# Patient Record
Sex: Female | Born: 1985 | Race: White | Hispanic: Yes | Marital: Married | Smoking: Never smoker
Health system: Southern US, Community
[De-identification: ages and names within clinical notes are randomized; demographics above are authoritative.]

## PROBLEM LIST (undated history)

## (undated) DIAGNOSIS — O24419 Gestational diabetes mellitus in pregnancy, unspecified control: Secondary | ICD-10-CM

## (undated) DIAGNOSIS — L0291 Cutaneous abscess, unspecified: Secondary | ICD-10-CM

## (undated) DIAGNOSIS — E119 Type 2 diabetes mellitus without complications: Secondary | ICD-10-CM

## (undated) HISTORY — PX: CHOLECYSTECTOMY: SHX55

## (undated) HISTORY — DX: Type 2 diabetes mellitus without complications: E11.9

---
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
## 2005-01-12 ENCOUNTER — Ambulatory Visit: Payer: Self-pay | Admitting: *Deleted

## 2005-01-12 ENCOUNTER — Inpatient Hospital Stay (HOSPITAL_COMMUNITY): Admission: AD | Admit: 2005-01-12 | Discharge: 2005-01-15 | Payer: Self-pay | Admitting: Family Medicine

## 2005-03-05 ENCOUNTER — Encounter (INDEPENDENT_AMBULATORY_CARE_PROVIDER_SITE_OTHER): Payer: Self-pay | Admitting: Specialist

## 2005-03-05 ENCOUNTER — Inpatient Hospital Stay (HOSPITAL_COMMUNITY): Admission: AD | Admit: 2005-03-05 | Discharge: 2005-03-06 | Payer: Self-pay | Admitting: General Surgery

## 2005-03-05 ENCOUNTER — Encounter: Payer: Self-pay | Admitting: *Deleted

## 2006-02-20 ENCOUNTER — Emergency Department (HOSPITAL_COMMUNITY): Admission: EM | Admit: 2006-02-20 | Discharge: 2006-02-20 | Payer: Self-pay | Admitting: Family Medicine

## 2007-02-14 ENCOUNTER — Ambulatory Visit (HOSPITAL_COMMUNITY): Admission: RE | Admit: 2007-02-14 | Discharge: 2007-02-14 | Payer: Self-pay | Admitting: Family Medicine

## 2007-05-20 ENCOUNTER — Inpatient Hospital Stay (HOSPITAL_COMMUNITY): Admission: AD | Admit: 2007-05-20 | Discharge: 2007-05-22 | Payer: Self-pay | Admitting: Obstetrics and Gynecology

## 2007-05-20 ENCOUNTER — Ambulatory Visit: Payer: Self-pay | Admitting: Obstetrics & Gynecology

## 2008-04-27 ENCOUNTER — Encounter (INDEPENDENT_AMBULATORY_CARE_PROVIDER_SITE_OTHER): Payer: Self-pay | Admitting: Family Medicine

## 2008-04-27 ENCOUNTER — Ambulatory Visit: Payer: Self-pay | Admitting: Internal Medicine

## 2008-04-27 LAB — CONVERTED CEMR LAB
Albumin: 4.9 g/dL (ref 3.5–5.2)
Alkaline Phosphatase: 105 units/L (ref 39–117)
BUN: 12 mg/dL (ref 6–23)
Creatinine, Ser: 0.53 mg/dL (ref 0.40–1.20)
Eosinophils Absolute: 1.6 10*3/uL — ABNORMAL HIGH (ref 0.0–0.7)
Eosinophils Relative: 18 % — ABNORMAL HIGH (ref 0–5)
Glucose, Bld: 105 mg/dL — ABNORMAL HIGH (ref 70–99)
HCT: 41 % (ref 36.0–46.0)
Hemoglobin: 14.2 g/dL (ref 12.0–15.0)
Lymphs Abs: 3.3 10*3/uL (ref 0.7–4.0)
MCV: 88.4 fL (ref 78.0–100.0)
Monocytes Absolute: 0.5 10*3/uL (ref 0.1–1.0)
Monocytes Relative: 6 % (ref 3–12)
Potassium: 4.1 meq/L (ref 3.5–5.3)
RBC: 4.64 M/uL (ref 3.87–5.11)
Total Bilirubin: 0.6 mg/dL (ref 0.3–1.2)
WBC: 8.9 10*3/uL (ref 4.0–10.5)

## 2008-04-30 ENCOUNTER — Ambulatory Visit: Payer: Self-pay | Admitting: *Deleted

## 2008-08-10 ENCOUNTER — Emergency Department (HOSPITAL_COMMUNITY): Admission: EM | Admit: 2008-08-10 | Discharge: 2008-08-10 | Payer: Self-pay | Admitting: Emergency Medicine

## 2011-03-31 NOTE — Discharge Summary (Signed)
NAME:  Angelica [name], Angelica [name]                   ACCOUNT NO.:  [id number]   MEDICAL RECORD NO.:  [id number]                   PATIENT TYPE:  INP   LOCATION:  9125                                 FACILITY:  [facility]   PHYSICIAN:  Mary Sella. Orlene Erm, M.D.                 DATE OF BIRTH:  February 09, 1986   DATE OF ADMISSION:  12/07/2002  DATE OF DISCHARGE:  12/11/2002                                 DISCHARGE SUMMARY   DISCHARGE DIAGNOSES:  1. Status post low transverse Cesarean section secondary to non-reassuring     fetal heart rate.  2. Endometritis.   DISCHARGE MEDICATIONS:  1. Percocet 5/325 mg one every four to six hours as needed pain.  2. Micronor each day beginning Sunday, February 2004.  3. Iron 325 mg each day.   FOLLOW UP:  Return to [facility] in six weeks for postpartum visit.   HISTORY OF PRESENT ILLNESS:  This 25 year old Gravida II, Para I, 0/0/1  presented at 39 weeks and two days with the onset of labor and the patient  was noted to have some variable repetitive D cells at that time.   LABORATORY DATA:  Blood type B, Rh positive, antibody screen negative,  hemoglobin 13.5. Rubella non-immune. Syphilis non-reactive. HIV negative.  Gonorrhea and Chlamydia negative. GBS negative.   HOSPITAL COURSE:  The patient was admitted and artificial rupture of  membranes and given an amnio infusion for repetitive variable D cells. An IU  PC was placed as well and the fluid appeared clear with no meconium. The  patient continued to have repetitive variable D cells despite position  changes and amnio infusion and Dr. Orlene Erm took the patient to the OR for a  low transverse Cesarean section. See operative report for further details.  The patient tolerated the procedure well and delivered a viable female infant  with Apgar's of 8 at one minute and 9 at five minutes with pH of 7.20. On  the evening after the procedure the patient began complaining of bilateral  shoulder pain with deep  inspiration and an ABG and chest x-ray were  obtained. The ABG showed a pH of 7.423, pCO2 of 32.1, pO2 of 99.4, bicarb  20.6 on room air. Chest x-ray showed no acute disease but probable small  amount of free air in the right upper quadrant most likely postoperative  from Cesarean section. The patient was saturating at 96% on 3 liters by  nasal cannula. CBC was drawn as well that showed a white count of 11.8 and  hemoglobin of 11.8. Platelets 308,000. Serial CBC's were obtained and on  postoperative day one, white count was 12.9 and hemoglobin was down to 9.1.  Platelets 259,000. Iron therapy was started at that time and the patient  developed a fever to 101.6. A urinalysis was obtained which was within  normal limits. A urine culture showed no growth. Ampicillin, Gentamycin, and  Clindamycin were started on postoperative day  one for probable endometritis  and the patient responded well and was afebrile for 48 hours prior to  discharge. On the day of discharge, she was afebrile. Vital signs were  stable. She was breast feeding without difficulty and hemoglobin was stable  at 9.1. She will follow-up at [facility] in six weeks for a postpartum  check.   LABORATORY DATA:  Additional lab data revealed PT of 13, INR 0.9, PTT 34,  fibrinogen 379.     Billey Gosling, M.D.                       Mary Sella. Orlene Erm, M.D.    AS/MEDQ  D:  01/20/2003  T:  01/20/2003  Job:  [id number]   cc:   Mary Sella. Orlene Erm, M.D.  [address]  [place]  [place] [address]  Fax: [phone]

## 2011-03-31 NOTE — Op Note (Signed)
NAME:  Angelica [name], Angelica [name]                   ACCOUNT NO.:  [id number]   MEDICAL RECORD NO.:  [id number]                   PATIENT TYPE:  INP   LOCATION:  9125                                 FACILITY:  [facility]   PHYSICIAN:  Mary Sella. Orlene Erm, M.D.                 DATE OF BIRTH:  June 25, 1986   DATE OF PROCEDURE:  12/07/2002  DATE OF DISCHARGE:                                 OPERATIVE REPORT   PREOPERATIVE DIAGNOSIS:  This 25 year old gravida 2, para 1, at term with  nonreassuring fetal heart rate tracing.   POSTOPERATIVE DIAGNOSIS:  This 25 year old gravida 2, para 1, at term with  nonreassuring fetal heart rate tracing.   PROCEDURE:  Primary low transverse cesarean section.   SURGEON:  Enid Cutter, M.D.   ASSISTANT:  Marlinda Mike, C.N.M.   ANESTHESIA:  General endotracheal.   COMPLICATIONS:  None.   ESTIMATED BLOOD LOSS:  800 cc.   FINDINGS:  A viable female infant delivered at 79 with Apgars of 8 and 9,  cord pH 7.20.  Normal uterus, tubes, and ovaries.  A hyperspiral cord at  delivery.   DISPOSITION:  Recovery room, stable.   INDICATIONS FOR PROCEDURE:  The patient is a 25 year old gravida 2, para 1,  who presents with spontaneous onset of labor at term.  The patient was  admitted and soon after admission was noted to have repetitive severe  variable decelerations.  There was good variability in between.  I was  contacted to see the patient and the patient was 5 cm dilated.  The patient  continued to labor.  However, the severe variables became more intense with  decreasing variability and prolonged recovery to baseline.  Reexamination of  the cervix revealed no cervical change and the patient was remote from  delivery at 5 cm dilatation.  The decision was made then to proceed with  primary low transverse C-section secondary to nonreassuring fetal heart  tracing.  The patient was counseled on the risk of cesarean section via the  telephone translator.  The patient was  counseled on the risk of bleeding,  infection, injury to internal organs, risk of transfusion or emergency  hysterectomy.  The patient understands these risks and wishes to proceed.   PROCEDURE:  The patient was taken to the operating room where she was  prepped and draped in sterile fashion.  She placed under general  endotracheal anesthesia and a Pfannenstiel incision was made with a scalpel.  This was carried down to the underlying fascia which was entered sharply.  The fascia was dissected with the surgeon's fingers and the rectus muscle  bellies were separated.  The peritoneum was entered bluntly with surgeon's  fingers and the bladder blade was placed.  The bladder flap was created with  sharp and blunt dissection.  The uterus was scored with a scalpel and  incision carried down to the midline.  Once the uterine cavity was entered,  the surgeon's fingers were used to extend the incision laterally.  The  infant's head was grasped and the infant was delivered through the uterine  incision.  The cord was clamped and cut and the infant was handed off to the  waiting neonatal resuscitation team.  The uterus was then massaged and the  placenta was extracted.  The uterus exteriorized and curetted with a dry lap  sponge.  The uterine incision was repaired with running, locking suture of 0  chromic.  A second imbricating suture of 0 chromic was used.  The incision  was hemostatic and the uterus was returned to the abdominal cavity.  The  paracolonic gutters were emptied of clot and debris.  The incision was again  inspected and noted to be hemostatic.  The fascia was then closed with a  running suture of 0 Vicryl.  The subcutaneous tissues were irrigated and the  skin edges were reapproximated with staples.  The patient tolerated the  procedure well and was awakened and taken to the recovery room in stable  condition.  Sponge, instrument, and needle counts were correct at the end of  the  procedure.                                               Mary Sella. Orlene Erm, M.D.    EMH/MEDQ  D:  12/07/2002  T:  12/07/2002  Job:  [id number]

## 2011-03-31 NOTE — H&P (Signed)
Angelica [name], Angelica [name]         ACCOUNT NO.:  [id number]   MEDICAL RECORD NO.:  [id number]          PATIENT TYPE:  INP   LOCATION:  0456                         FACILITY:  [facility]   PHYSICIAN:  Angelia Mould. Derrell Lolling, M.D.DATE OF BIRTH:  1986-07-27   DATE OF ADMISSION:  03/05/2005  DATE OF DISCHARGE:                                HISTORY & PHYSICAL   CHIEF COMPLAINT:  Epigastric pain and vomiting.   HISTORY OF PRESENT ILLNESS:  This is a 25 year old Hispanic female,  originally from [place].  She reports epigastric pain radiating to her  back for the past 4 days.  She has been vomiting every day.  She has felt  feverish but has not taken her temperature.  She has not had any prior  similar episodes.  She is 1 month postpartum.  She went to the maternity  admissions unit at [facility] where she was evaluated by a nurse  practitioner.  An ultrasound was obtained which showed gallstones but was  otherwise normal including a normal looking pancreas and a normal looking  common bile duct.  Lab work was obtained which showed a hemoglobin of 10.9,  white count of 11,100, a reportedly normal pregnancy test, AST of 44, ALT of  124, total bilirubin 0.4, amylase 167, lipase of 45.  I was called.  Because  the patient was still having pain, she was transferred to [facility] for  decision making regarding management of her biliary tract disease.   PAST HISTORY:  She has had three pregnancies and three deliveries.  The  second pregnancy required a cesarean section but her most recent one did  not.  All three children are healthy.  She denies having any prior medical  or surgical problems other than the cesarean section.  She denies a history  of malaria or diabetes or hypertension.   CURRENT MEDICATIONS:  None.   DRUG ALLERGIES:  None known.   SOCIAL HISTORY:  The patient states that she is married.  She states that  the man in the room with [place] is her husband.  She states that she  immigrated  from [place] to the [place] 3 years ago.  She is unemployed.  She  denies the use of alcohol or tobacco.   FAMILY HISTORY:  Mother and father living and well.  She thinks her mother  has had a kidney problem recently.   REVIEW OF SYSTEMS:  All systems were reviewed.  They are noncontributory  except as described above.   PHYSICAL EXAMINATION:  GENERAL:  Healthy-appearing young Hispanic female in  mild to moderate distress.  She is cooperative.  The entire history and  physical exam encounter was chaperoned by Elease Hashimoto, the nurse on the floor  and interpreter Candra interpreted all of the interaction.  VITAL SIGNS:  Temperature 98.6, pulse 73, respirations 20, blood pressure  145/92.  HEENT:  Eyes:  Sclera clear.  Extraocular movements intact.  Ears, mouth and  throat, nose, lips, tongue and oropharynx is without gross lesions.  NECK:  Neck supple, nontender.  No adenopathy, no mass, no jugular venous  distention.  CHEST:  Lungs clear  to auscultation.  No real chest wall tenderness.  Breasts not examined.  Heart regular rate and rhythm.  No murmurs noted.  Radial, femoral, and posterior tibial pulses are palpable.  No peripheral  edema.  ABDOMEN:  She is tender in the epigastrium and the right upper quadrant with  minimal guarding.  No rebound, no mass, no distention.  Bowel sounds are  hypoactive.  No hernias noted.  EXTREMITIES:  She moves all four extremities well without pain or deformity.  NEUROLOGIC:  No gross motor or sensory deficits.   ADMISSION DATA:  Ultrasound and lab work as described above.   ASSESSMENT:  1.  Acute and chronic cholecystitis with cholelithiasis.  Due to persistent      pain for 4 days, she will need to be admitted to the hospital for      cholecystectomy.  2.  One month postpartum without apparent gynecologic or obstetric      complications.   PLAN:  1.  The patient will be admitted to the hospital.  2.  She will be started on  intravenous Unasyn.  3.  She will be taken to the operating room later today for laparoscopic      cholecystectomy with cholangiogram.   I have discussed the indication and details of surgery with her.  Risks and  complications have been outlined, including but not limited to bleeding,  infection, conversion to open laparotomy, injury to adjacent organ such as  the main bile duct or intestine with major reconstructive surgery, wound  problem such as infection or hernia, cardiac, pulmonary and thromboembolic  problems.  She seems to understand these issues well.  At this time all of  her questions were answered.  She is in full agreement with this plan as is  her husband.   As mentioned above the entire encounter was interpreted with our interpreter  Candra.      HMI/MEDQ  D:  03/05/2005  T:  03/05/2005  Job:  [id number]

## 2011-03-31 NOTE — Op Note (Signed)
NAMESIMARA, [name]         ACCOUNT NO.:  [id number]   MEDICAL RECORD NO.:  [id number]          PATIENT TYPE:  INP   LOCATION:  0456                         FACILITY:  [facility]   PHYSICIAN:  Angelia Mould. Derrell Lolling, M.D.DATE OF BIRTH:  04/13/1986   DATE OF PROCEDURE:  03/05/2005  DATE OF DISCHARGE:                                 OPERATIVE REPORT   PREOPERATIVE DIAGNOSIS:  Acute cholecystitis with cholelithiasis.   POSTOPERATIVE DIAGNOSIS:  Acute cholecystitis with cholelithiasis.   OPERATION PERFORMED:  Laparoscopic cholecystectomy with intraoperative  cholangiogram.   SURGEON:  Angelia Mould. Derrell Lolling, M.D.   FIRST ASSISTANT:  Gita Kudo, M.D.   OPERATIVE INDICATIONS:  This is a 25 year old Hispanic female who presents  with a four-day history of epigastric pain, nausea, vomiting, and back pain.  She has had three pregnancies and three deliveries.  She is one month post  partum.  She was evaluated and worked up.  Ultrasound reveals gallstones but  is otherwise normal.  White count 11,100.  Pregnancy test negative.  AST and  ALT are slightly elevated with a total bilirubin normal.  Amylase is  slightly elevated to 167.  Lipase normal at 45.  She is admitted to the  hospital today and brought to the operating room urgently for  cholecystectomy.   OPERATIVE FINDINGS:  The gallbladder was acutely inflamed.  It was quite  thick-walled and edematous.  The cholangiogram was normal, showing normal  intrahepatic and extrahepatic biliary ducts.  There was no filling defect  and no obstruction with good flow of contrast into the duodenum.  The liver,  stomach, duodenum, small intestines, large intestines, and appendix looked  normal.  The bladder was distended.  There was a little bit of blood in the  pelvis.  The uterus did not appear enlarged.   OPERATIVE TECHNIQUE:  Following the induction of general endotracheal  anesthesia, the patient's abdomen was prepped and draped in a  sterile  fashion.  Marcaine 0.5% with epinephrine was used as a local infiltration  anesthetic.  A vertically oriented incision was made at the lower end of the  umbilicus.  The fascia was incised in the midline, and the abdominal cavity  was entered under direct vision.  A 10 mm Hasson trocar was inserted and  secured with a purse-string suture of 0 Vicryl.  A pneumoperitoneum was  created.  The video camera was inserted with visualization and findings as  described above.  A 10 mm trocar was placed in the subxiphoid region, and  two 5 mm trocars were placed in the right mid abdomen.  The gallbladder was  very tense and thick-walled and had to be decompressed with the suction  trocar.  We then elevated the gallbladder and retracted the infundibulum  laterally.  We dissected out the cystic duct and the cystic artery.  The  cystic artery was isolated as it went onto the wall of the gallbladder,  secured with multiple metal clips and divided.  We then created a large  window behind the cystic duct.  We dissected out a very long segment of  cystic duct.  The cholangiogram catheter was  inserted into the cystic duct.  A cholangiogram was obtained using the C-arm.  This had normal findings, as  described above.  We had normal intrahepatic and extrahepatic biliary  anatomy.  We did not see any any filling defects, and there was good flow of  contrast into the duodenum.  The cholangiogram catheter was removed.  The  cystic duct was secured with multiple metal clips and divided.  The  gallbladder was dissected from its bed with electrocautery, placed in a  specimen bag, and removed.  We had a little bit of bleeding from the mid  portion of the bed of the gallbladder, which we cauterized.  We had to do  this several times, so we chose to put a piece of Surgicel in place, and  after five minutes, it was completely dry.  After removal of the  gallbladder, we irrigated the abdomen and subphrenic space  and pelvis quite  thoroughly, until all of the irrigation fluid was completely clear.  There  was no sign of any bleeding and no sign of any bile leak whatsoever.  The  trocars were removed under direct vision.  There was no bleeding from the  trocar sites.  The pneumoperitoneum was released.  The fascia at the  umbilicus was closed with 0 Vicryl sutures.  The skin incisions were closed  with subcuticular sutures of 4-0 Vicryl and Steri-Strips.  Clean bandages  were placed.  The patient was taken to the recovery room in stable  condition.  Estimated blood loss was about 15-20 cc.  Complications were  none.  Sponge, needle, and instrument counts were correct.      HMI/MEDQ  D:  03/05/2005  T:  03/05/2005  Job:  [id number]

## 2011-08-14 LAB — POCT PREGNANCY, URINE: Preg Test, Ur: NEGATIVE

## 2011-08-29 LAB — CBC
HCT: 37.5
Hemoglobin: 10.6 — ABNORMAL LOW
MCHC: 33.3
Platelets: 291
RBC: 3.73 — ABNORMAL LOW
RBC: 4.48
WBC: 9.5

## 2018-07-08 ENCOUNTER — Ambulatory Visit (HOSPITAL_COMMUNITY)
Admission: EM | Admit: 2018-07-08 | Discharge: 2018-07-08 | Disposition: A | Discharge status: Home / Self Care | Payer: Self-pay | Attending: Family Medicine | Admitting: Family Medicine

## 2018-07-08 ENCOUNTER — Encounter (HOSPITAL_COMMUNITY): Payer: Self-pay

## 2018-07-08 DIAGNOSIS — R739 Hyperglycemia, unspecified: Secondary | ICD-10-CM

## 2018-07-08 DIAGNOSIS — R1032 Left lower quadrant pain: Secondary | ICD-10-CM

## 2018-07-08 DIAGNOSIS — R1031 Right lower quadrant pain: Secondary | ICD-10-CM

## 2018-07-08 DIAGNOSIS — R112 Nausea with vomiting, unspecified: Secondary | ICD-10-CM

## 2018-07-08 LAB — POCT I-STAT, CHEM 8
BUN: 11 mg/dL (ref 6–20)
CALCIUM ION: 1.21 mmol/L (ref 1.15–1.40)
CREATININE: 0.5 mg/dL (ref 0.44–1.00)
Chloride: 94 mmol/L — ABNORMAL LOW (ref 98–111)
HCT: 43 % (ref 36.0–46.0)
HEMOGLOBIN: 14.6 g/dL (ref 12.0–15.0)
POTASSIUM: 4.1 mmol/L (ref 3.5–5.1)
SODIUM: 131 mmol/L — AB (ref 135–145)
TCO2: 24 mmol/L (ref 22–32)

## 2018-07-08 LAB — POCT URINALYSIS DIP (DEVICE)
Bilirubin Urine: NEGATIVE
Glucose, UA: >=1000 mg/dL — AB
Ketones, ur: NEGATIVE mg/dL
LEUKOCYTES UA: NEGATIVE
Nitrite: NEGATIVE
Protein, ur: NEGATIVE mg/dL
UROBILINOGEN UA: 0.2 mg/dL (ref 0.0–1.0)
pH: 5.5 (ref 5.0–8.0)

## 2018-07-08 LAB — POCT PREGNANCY, URINE: PREG TEST UR: NEGATIVE

## 2018-07-08 NOTE — Discharge Instructions (Signed)
Your glucose was greater than 700, greater than 1000 glucose in urine, with abdominal pain, nausea, vomiting, please go to the emergency department for further evaluation for new onset diabetes with hyperglycemia.

## 2018-07-08 NOTE — ED Triage Notes (Signed)
Pt presents with frequent urination, thirstiness, feeling lethargic, rash and itchiness all over body and frequent headaches for the past month.

## 2018-07-08 NOTE — ED Provider Notes (Signed)
[facility]    CSN: [id number] Arrival date & time: 07/08/18  1347     History   Chief Complaint Chief Complaint  Patient presents with  . Fatigue  . Polydipsia  . Rash    HPI Nilda Jamelle Rushing is a 32 y.o. female.   32 year old female comes in for 1 month history of polyuria, polydipsia, abdominal pain, nausea, vomiting, lethargy.  HPI obtained by patient through friend translating, declined Radiation protection practitioner.  Patient has had intermittent headache as well that is frequent, right-sided, but denies current headache.  She has had low abdominal tenderness with nausea and vomiting.  Denies URI symptoms such as cough, congestion, rhinorrhea.  Has had subjective fever at night with chills.  Denies vaginal discharge, itching, pain.  She denies dysuria, hematuria.  States has had itching to the arms with hives.  No new contacts/exposures.  Denies tick bites.  She gets Depo-Medrol injections, last one last month.  Not currently sexually active.  Never smoker.  Denies alcohol use, illicit drug use.     History reviewed. No pertinent past medical history.  There are no active problems to display for this patient.   History reviewed. No pertinent surgical history.  OB History   None      Home Medications    Prior to Admission medications   Not on File    Family History History reviewed. No pertinent family history.  Social History Social History   Tobacco Use  . Smoking status: Not on file  Substance Use Topics  . Alcohol use: Not on file  . Drug use: Not on file     Allergies   Patient has no known allergies.   Review of Systems Review of Systems  Reason unable to perform ROS: See HPI as above.     Physical Exam Triage Vital Signs ED Triage Vitals [07/08/18 1413]  Enc Vitals Group     BP 131/75     Pulse Rate 95     Resp 20     Temp 97.9 F (36.6 C)     Temp Source Temporal     SpO2 100 %     Weight      Height      Head  Circumference      Peak Flow      Pain Score      Pain Loc      Pain Edu?      Excl. in GC?    No data found.  Updated Vital Signs BP 131/75 (BP Location: Right Arm)   Pulse 95   Temp 97.9 F (36.6 C) (Temporal)   Resp 20   LMP  (LMP Unknown)   SpO2 100%   Physical Exam  Constitutional: She is oriented to person, place, and time. She appears well-developed and well-nourished. No distress.  HENT:  Head: Normocephalic and atraumatic.  Eyes: Pupils are equal, round, and reactive to light. Conjunctivae are normal.  Cardiovascular: Normal rate, regular rhythm and normal heart sounds. Exam reveals no gallop and no friction rub.  No murmur heard. Pulmonary/Chest: Effort normal and breath sounds normal. She has no wheezes. She has no rales.  Abdominal: Soft. Bowel sounds are normal. She exhibits no mass. There is no rigidity, no rebound, no guarding and no CVA tenderness.  Patient expresses tenderness to palpation of bilateral low abdomen without changes in expression, rebound, guarding.  Neurological: She is alert and oriented to person, place, and time.  Skin: Skin is warm and  dry.  Bilateral upper extremity maculopapular rash.  Scratch marks seen.  No erythema, warmth.  No tenderness to palpation.   Psychiatric: She has a normal mood and affect. Her behavior is normal. Judgment normal.    UC Treatments / Results  Labs (all labs ordered are listed, but only abnormal results are displayed) Labs Reviewed  POCT URINALYSIS DIP (DEVICE) - Abnormal; Notable for the following components:      Result Value   Glucose, UA >=1000 (*)    Hgb urine dipstick MODERATE (*)    All other components within normal limits  POCT I-STAT, CHEM 8 - Abnormal; Notable for the following components:   Sodium 131 (*)    Chloride 94 (*)    Glucose, Bld >700 (*)    All other components within normal limits  POCT PREGNANCY, URINE    EKG None  Radiology No results found.  Procedures Procedures  (including critical care time)  Medications Ordered in UC Medications - No data to display  Initial Impression / Assessment and Plan / UC Course  I have reviewed the triage vital signs and the nursing notes.  Pertinent labs & imaging results that were available during my care of the patient were reviewed by me and considered in my medical decision making (see chart for details).    32 year old female presents with 1 month history of polydipsia, polyuria, abdominal pain, nausea/vomiting, lethargy.  She has also had intermittent headache, and rash with itching.  Denies history of diabetes.  She has mild tenderness to palpation of bilateral lower abdomen, though without guarding/rebound/changes in expression.  I-STAT showed glucose of greater than 700.  Given new onset diabetes with hyperglycemia, patient with abdominal pain, nausea, vomiting, will discharge in stable condition to the emergency department for further evaluation and management needed.  Patient expresses understanding and agrees to plan.  Final Clinical Impressions(s) / UC Diagnoses   Final diagnoses:  Hyperglycemia  Bilateral lower abdominal pain  Intractable vomiting with nausea, unspecified vomiting type    ED Prescriptions    None        Belinda FisherYu, Nicolena Schurman V, PA-C 07/08/18 1503

## 2018-12-24 ENCOUNTER — Emergency Department (HOSPITAL_COMMUNITY): Payer: Self-pay

## 2018-12-24 ENCOUNTER — Emergency Department (HOSPITAL_COMMUNITY)
Admission: EM | Admit: 2018-12-24 | Discharge: 2018-12-24 | Disposition: A | Discharge status: Home / Self Care | Payer: Self-pay | Attending: Emergency Medicine | Admitting: Emergency Medicine

## 2018-12-24 ENCOUNTER — Encounter (HOSPITAL_COMMUNITY): Payer: Self-pay

## 2018-12-24 ENCOUNTER — Other Ambulatory Visit: Payer: Self-pay

## 2018-12-24 DIAGNOSIS — Y990 Civilian activity done for income or pay: Secondary | ICD-10-CM | POA: Insufficient documentation

## 2018-12-24 DIAGNOSIS — S0003XA Contusion of scalp, initial encounter: Secondary | ICD-10-CM | POA: Insufficient documentation

## 2018-12-24 DIAGNOSIS — Y9389 Activity, other specified: Secondary | ICD-10-CM | POA: Insufficient documentation

## 2018-12-24 DIAGNOSIS — W208XXA Other cause of strike by thrown, projected or falling object, initial encounter: Secondary | ICD-10-CM | POA: Insufficient documentation

## 2018-12-24 DIAGNOSIS — S0990XA Unspecified injury of head, initial encounter: Secondary | ICD-10-CM

## 2018-12-24 DIAGNOSIS — Y9289 Other specified places as the place of occurrence of the external cause: Secondary | ICD-10-CM | POA: Insufficient documentation

## 2018-12-24 NOTE — Discharge Instructions (Signed)
Return for any problem.  Follow-up with your regular care provider as instructed. °

## 2018-12-24 NOTE — ED Triage Notes (Signed)
Spanish interpreter used for triage. 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 Pt was hit on the left side of her head, reports blurred vision, dizziness. Pt a.o, ambulatory in triage.

## 2018-12-24 NOTE — ED Provider Notes (Signed)
[facility]   CSN: [id number] Arrival date & time: 12/24/18  1357     History   Chief Complaint Chief Complaint  Patient presents with  . Head Injury    HPI Angelica [name] is a 33 y.o. female.  33 year old female with prior medical history as detailed below presents for evaluation of close head injury.  Patient reports that she was at work.  A piece of metal fell and struck her on the left scalp.  She did not pass out.  She denies other injury.  She denies bleeding or break in the skin.  The history is provided by the patient and medical records. A language interpreter was used.  Head Injury  Location:  L parietal Mechanism of injury: direct blow   Pain details:    Quality:  Aching   Severity:  Mild   Timing:  Constant Chronicity:  New Relieved by:  Nothing Worsened by:  Nothing Ineffective treatments:  None tried   History reviewed. No pertinent past medical history.  There are no active problems to display for this patient.   History reviewed. No pertinent surgical history.   OB History   No obstetric history on file.      Home Medications    Prior to Admission medications   Not on File    Family History No family history on file.  Social History Social History   Tobacco Use  . Smoking status: Not on file  Substance Use Topics  . Alcohol use: Not on file  . Drug use: Not on file     Allergies   Patient has no known allergies.   Review of Systems Review of Systems  All other systems reviewed and are negative.    Physical Exam Updated Vital Signs BP 125/90   Pulse 82   Temp 98.8 F (37.1 C) (Oral)   Resp 16   SpO2 97%   Physical Exam Vitals signs and nursing note reviewed.  Constitutional:      General: She is not in acute distress.    Appearance: She is well-developed.  HENT:     Head: Normocephalic.     Comments: Mild contusion of the left scalp.  No bruising or  laceration noted. Eyes:     Conjunctiva/sclera: Conjunctivae normal.     Pupils: Pupils are equal, round, and reactive to light.  Neck:     Musculoskeletal: Normal range of motion and neck supple.     Comments: No cervical midline tenderness Cardiovascular:     Rate and Rhythm: Normal rate and regular rhythm.     Heart sounds: Normal heart sounds.  Pulmonary:     Effort: Pulmonary effort is normal. No respiratory distress.     Breath sounds: Normal breath sounds.  Abdominal:     General: There is no distension.     Palpations: Abdomen is soft.     Tenderness: There is no abdominal tenderness.  Musculoskeletal: Normal range of motion.        General: No deformity.  Skin:    General: Skin is warm and dry.  Neurological:     General: No focal deficit present.     Mental Status: She is alert and oriented to person, place, and time. Mental status is at baseline.     Cranial Nerves: No cranial nerve deficit.     Sensory: No sensory deficit.     Motor: No weakness.     Coordination: Coordination normal.  Comments: GCS 15      ED Treatments / Results  Labs (all labs ordered are listed, but only abnormal results are displayed) Labs Reviewed - No data to display  EKG None  Radiology Ct Head Wo Contrast  Result Date: 12/24/2018 CLINICAL DATA:  Piece of metal fell on head. EXAM: CT HEAD WITHOUT CONTRAST TECHNIQUE: Contiguous axial images were obtained from the base of the skull through the vertex without intravenous contrast. COMPARISON:  None. FINDINGS: Brain: No evidence of acute infarction, hemorrhage, hydrocephalus, extra-axial collection or mass lesion/mass effect. Vascular: No hyperdense vessel or unexpected calcification. Skull: Normal. Negative for fracture or focal lesion. Sinuses/Orbits: No acute finding. Other: Tiny 2 mm soft tissue debris involving the high left frontal scalp series 5/9. No significant soft tissue swelling. IMPRESSION: No acute intracranial abnormality.   Tiny left frontal scalp debris. Electronically Signed   By: Tollie Eth M.D.   On: 12/24/2018 17:02    Procedures Procedures (including critical care time)  Medications Ordered in ED Medications - No data to display   Initial Impression / Assessment and Plan / ED Course  I have reviewed the triage vital signs and the nursing notes.  Pertinent labs & imaging results that were available during my care of the patient were reviewed by me and considered in my medical decision making (see chart for details).     MDM  Screen complete  Patient is presenting for evaluation of close head injury.  Patient's exam and history do not demonstrate significant traumatic injury.  CT imaging does not reveal significant intercranial pathology.  Patient appears to be appropriate for discharge.  Strict return precautions given and understood.  Importance of close follow-up is stressed.  Final Clinical Impressions(s) / ED Diagnoses   Final diagnoses:  Closed head injury, initial encounter    ED Discharge Orders    None       Wynetta Fines, MD 12/24/18 1743

## 2019-01-10 ENCOUNTER — Encounter (HOSPITAL_COMMUNITY): Payer: Self-pay | Admitting: Emergency Medicine

## 2019-01-10 ENCOUNTER — Emergency Department (HOSPITAL_COMMUNITY)
Admission: EM | Admit: 2019-01-10 | Discharge: 2019-01-11 | Disposition: A | Discharge status: Home / Self Care | Payer: Self-pay | Attending: Emergency Medicine | Admitting: Emergency Medicine

## 2019-01-10 DIAGNOSIS — L0231 Cutaneous abscess of buttock: Secondary | ICD-10-CM | POA: Insufficient documentation

## 2019-01-10 DIAGNOSIS — R739 Hyperglycemia, unspecified: Secondary | ICD-10-CM

## 2019-01-10 DIAGNOSIS — L0291 Cutaneous abscess, unspecified: Secondary | ICD-10-CM

## 2019-01-10 DIAGNOSIS — E1165 Type 2 diabetes mellitus with hyperglycemia: Secondary | ICD-10-CM | POA: Insufficient documentation

## 2019-01-10 HISTORY — DX: Type 2 diabetes mellitus without complications: E11.9

## 2019-01-10 LAB — CBC WITH DIFFERENTIAL/PLATELET
ABS IMMATURE GRANULOCYTES: 0.01 10*3/uL (ref 0.00–0.07)
BASOS ABS: 0 10*3/uL (ref 0.0–0.1)
BASOS PCT: 1 %
EOS PCT: 6 %
Eosinophils Absolute: 0.5 10*3/uL (ref 0.0–0.5)
HCT: 40.4 % (ref 36.0–46.0)
Hemoglobin: 13.3 g/dL (ref 12.0–15.0)
Immature Granulocytes: 0 %
LYMPHS PCT: 24 %
Lymphs Abs: 1.9 10*3/uL (ref 0.7–4.0)
MCH: 27.9 pg (ref 26.0–34.0)
MCHC: 32.9 g/dL (ref 30.0–36.0)
MCV: 84.7 fL (ref 80.0–100.0)
Monocytes Absolute: 0.6 10*3/uL (ref 0.1–1.0)
Monocytes Relative: 8 %
NEUTROS ABS: 4.8 10*3/uL (ref 1.7–7.7)
NRBC: 0 % (ref 0.0–0.2)
Neutrophils Relative %: 61 %
PLATELETS: 358 10*3/uL (ref 150–400)
RBC: 4.77 MIL/uL (ref 3.87–5.11)
RDW: 12 % (ref 11.5–15.5)
WBC: 7.8 10*3/uL (ref 4.0–10.5)

## 2019-01-10 LAB — URINALYSIS, ROUTINE W REFLEX MICROSCOPIC
BILIRUBIN URINE: NEGATIVE
Glucose, UA: >=500 mg/dL — AB
KETONES UR: NEGATIVE mg/dL
LEUKOCYTE UA: NEGATIVE
NITRITE: NEGATIVE
PROTEIN: NEGATIVE mg/dL
RBC / HPF: >50 RBC/hpf — ABNORMAL HIGH (ref 0–5)
Specific Gravity, Urine: 1.027 (ref 1.005–1.030)
pH: 6 (ref 5.0–8.0)

## 2019-01-10 LAB — COMPREHENSIVE METABOLIC PANEL
ALBUMIN: 4.1 g/dL (ref 3.5–5.0)
ALT: 18 U/L (ref 0–44)
ANION GAP: 12 (ref 5–15)
AST: 19 U/L (ref 15–41)
Alkaline Phosphatase: 109 U/L (ref 38–126)
BUN: 9 mg/dL (ref 6–20)
CHLORIDE: 100 mmol/L (ref 98–111)
CO2: 21 mmol/L — ABNORMAL LOW (ref 22–32)
Calcium: 9.3 mg/dL (ref 8.9–10.3)
Creatinine, Ser: 0.53 mg/dL (ref 0.44–1.00)
GFR calc non Af Amer: >60 mL/min (ref >60)
GLUCOSE: 389 mg/dL — AB (ref 70–99)
POTASSIUM: 3.8 mmol/L (ref 3.5–5.1)
SODIUM: 133 mmol/L — AB (ref 135–145)
Total Bilirubin: 0.3 mg/dL (ref 0.3–1.2)
Total Protein: 7.4 g/dL (ref 6.5–8.1)

## 2019-01-10 LAB — I-STAT BETA HCG BLOOD, ED (MC, WL, AP ONLY): I-stat hCG, quantitative: <5 m[IU]/mL (ref <5)

## 2019-01-10 LAB — CBG MONITORING, ED: GLUCOSE-CAPILLARY: 433 mg/dL — AB (ref 70–99)

## 2019-01-10 NOTE — ED Triage Notes (Signed)
Patient presents with elevated blood sugar=433 , she has not taken her medications for 2 months , pt. injected her urine at her left buttocks this Saturday - she stated that in [place] injecting urine has cured diabetes ( someone she knows) , presents with left buttocks swelling/redness .

## 2019-01-11 ENCOUNTER — Encounter (HOSPITAL_COMMUNITY): Payer: Self-pay | Admitting: Emergency Medicine

## 2019-01-11 MED ORDER — METFORMIN HCL 850 MG PO TABS
850.0000 mg | ORAL_TABLET | Freq: Once | ORAL | Status: AC
  Administered 2019-01-11: 850 mg via ORAL
  Filled 2019-01-11: qty 1

## 2019-01-11 MED ORDER — IBUPROFEN 800 MG PO TABS
800.0000 mg | ORAL_TABLET | Freq: Once | ORAL | Status: AC
Start: 2019-01-11 — End: 2019-01-11
  Administered 2019-01-11: 800 mg via ORAL
  Filled 2019-01-11: qty 1

## 2019-01-11 MED ORDER — CEPHALEXIN 500 MG PO CAPS: 500.0000 mg | ORAL_CAPSULE | Freq: Four times a day (QID) | ORAL | 0 refills | Status: DC

## 2019-01-11 MED ORDER — DOXYCYCLINE HYCLATE 100 MG PO CAPS: 100.0000 mg | ORAL_CAPSULE | Freq: Two times a day (BID) | ORAL | 0 refills | Status: DC

## 2019-01-11 MED ORDER — CEPHALEXIN 250 MG PO CAPS
500.0000 mg | ORAL_CAPSULE | ORAL | Status: AC
  Administered 2019-01-11: 500 mg via ORAL
  Filled 2019-01-11: qty 2

## 2019-01-11 MED ORDER — TRAMADOL HCL 50 MG PO TABS
50.0000 mg | ORAL_TABLET | Freq: Once | ORAL | Status: AC
  Administered 2019-01-11: 50 mg via ORAL
  Filled 2019-01-11: qty 1

## 2019-01-11 MED ORDER — LIDOCAINE-EPINEPHRINE-TETRACAINE (LET) SOLUTION
3.0000 mL | Freq: Once | NASAL | Status: AC
  Administered 2019-01-11: 3 mL via TOPICAL
  Filled 2019-01-11: qty 3

## 2019-01-11 MED ORDER — DOXYCYCLINE HYCLATE 100 MG PO TABS
100.0000 mg | ORAL_TABLET | Freq: Once | ORAL | Status: AC
  Administered 2019-01-11: 100 mg via ORAL
  Filled 2019-01-11: qty 1

## 2019-01-11 MED ORDER — METFORMIN HCL 500 MG PO TABS: 500.0000 mg | ORAL_TABLET | Freq: Two times a day (BID) | ORAL | 0 refills | Status: DC

## 2019-01-11 MED ORDER — NAPROXEN 375 MG PO TABS: 375.0000 mg | ORAL_TABLET | Freq: Two times a day (BID) | ORAL | 0 refills | Status: DC

## 2019-01-11 MED ORDER — LIDOCAINE-EPINEPHRINE (PF) 2 %-1:200000 IJ SOLN
20.0000 mL | Freq: Once | INTRAMUSCULAR | Status: AC
  Administered 2019-01-11: 20 mL via INTRADERMAL
  Filled 2019-01-11: qty 20

## 2019-01-11 NOTE — ED Notes (Signed)
Patient verbalizes understanding of discharge instructions. Opportunity for questioning and answers were provided. Armband removed by staff, pt discharged from ED in wheelchair.  

## 2019-01-11 NOTE — ED Provider Notes (Signed)
[facility] EMERGENCY DEPARTMENT Provider Note   CSN: [id number] Arrival date & time: 01/10/19  2053    History   Chief Complaint Chief Complaint  Patient presents with  . Hyperglycemia  . Cellulitis    HPI Angelica [name] is a 33 y.o. female.     The history is provided by the patient. The history is limited by a language barrier. A language interpreter was used.  Abscess  Location:  Pelvis Pelvic abscess location:  L buttock Abscess quality: draining, induration, painful and warmth   Red streaking: no   Duration:  8 days Progression:  Worsening Pain details:    Quality:  Dull   Severity:  Severe   Duration:  2 days   Timing:  Constant   Progression:  Worsening Chronicity:  New Context: diabetes   Context comment:  Injecting her own urine into her buttock in attempta to cure her diabetes Relieved by:  Nothing Worsened by:  Nothing Ineffective treatments:  None tried Associated symptoms: no anorexia and no fever   Risk factors: no prior abscess   Patient with known diabetes who used a clean syringe 8 days ago to inject her own urin into her left buttock to "cure" her diabetes.  No f/c/r.    Past Medical History:  Diagnosis Date  . Diabetes mellitus without complication (HCC)     There are no active problems to display for this patient.   History reviewed. No pertinent surgical history.   OB History   No obstetric history on file.      Home Medications    Prior to Admission medications   Not on File    Family History No family history on file.  Social History Social History   Tobacco Use  . Smoking status: Never Smoker  . Smokeless tobacco: Never Used  Substance Use Topics  . Alcohol use: Never    Frequency: Never  . Drug use: Never     Allergies   Patient has no known allergies.   Review of Systems Review of Systems  Constitutional: Negative for fever.  Respiratory: Negative for shortness of breath.     Cardiovascular: Negative for chest pain.  Gastrointestinal: Negative for anorexia.  Musculoskeletal: Negative for gait problem.  Skin: Negative for pallor.  All other systems reviewed and are negative.    Physical Exam Updated Vital Signs BP 110/68 (BP Location: Right Arm)   Pulse 98   Temp 98.6 F (37 C) (Oral)   Resp 18   SpO2 99%   Physical Exam Vitals signs and nursing note reviewed.  Constitutional:      Appearance: Normal appearance.  HENT:     Head: Normocephalic and atraumatic.     Nose: Nose normal.     Mouth/Throat:     Mouth: Mucous membranes are moist.     Pharynx: Oropharynx is clear.  Eyes:     Conjunctiva/sclera: Conjunctivae normal.     Pupils: Pupils are equal, round, and reactive to light.  Neck:     Musculoskeletal: Normal range of motion and neck supple.  Cardiovascular:     Rate and Rhythm: Normal rate and regular rhythm.     Pulses: Normal pulses.     Heart sounds: Normal heart sounds.  Pulmonary:     Effort: Pulmonary effort is normal.     Breath sounds: Normal breath sounds.  Abdominal:     General: Abdomen is flat. Bowel sounds are normal.     Tenderness: There is  no abdominal tenderness. There is no guarding or rebound.  Musculoskeletal: Normal range of motion.       Arms:  Skin:    General: Skin is warm and dry.     Capillary Refill: Capillary refill takes less than 2 seconds.  Neurological:     General: No focal deficit present.     Mental Status: She is alert and oriented to person, place, and time.     Deep Tendon Reflexes: Reflexes normal.  Psychiatric:        Mood and Affect: Mood normal.        Behavior: Behavior normal.      ED Treatments / Results  Labs (all labs ordered are listed, but only abnormal results are displayed) Results for orders placed or performed during the hospital encounter of 01/10/19  CBC with Differential  Result Value Ref Range   WBC 7.8 4.0 - 10.5 K/uL   RBC 4.77 3.87 - 5.11 MIL/uL    Hemoglobin 13.3 12.0 - 15.0 g/dL   HCT 98.3 38.2 - 50.5 %   MCV 84.7 80.0 - 100.0 fL   MCH 27.9 26.0 - 34.0 pg   MCHC 32.9 30.0 - 36.0 g/dL   RDW 39.7 67.3 - 41.9 %   Platelets 358 150 - 400 K/uL   nRBC 0.0 0.0 - 0.2 %   Neutrophils Relative % 61 %   Neutro Abs 4.8 1.7 - 7.7 K/uL   Lymphocytes Relative 24 %   Lymphs Abs 1.9 0.7 - 4.0 K/uL   Monocytes Relative 8 %   Monocytes Absolute 0.6 0.1 - 1.0 K/uL   Eosinophils Relative 6 %   Eosinophils Absolute 0.5 0.0 - 0.5 K/uL   Basophils Relative 1 %   Basophils Absolute 0.0 0.0 - 0.1 K/uL   Immature Granulocytes 0 %   Abs Immature Granulocytes 0.01 0.00 - 0.07 K/uL  Comprehensive metabolic panel  Result Value Ref Range   Sodium 133 (L) 135 - 145 mmol/L   Potassium 3.8 3.5 - 5.1 mmol/L   Chloride 100 98 - 111 mmol/L   CO2 21 (L) 22 - 32 mmol/L   Glucose, Bld 389 (H) 70 - 99 mg/dL   BUN 9 6 - 20 mg/dL   Creatinine, Ser 3.79 0.44 - 1.00 mg/dL   Calcium 9.3 8.9 - 02.4 mg/dL   Total Protein 7.4 6.5 - 8.1 g/dL   Albumin 4.1 3.5 - 5.0 g/dL   AST 19 15 - 41 U/L   ALT 18 0 - 44 U/L   Alkaline Phosphatase 109 38 - 126 U/L   Total Bilirubin 0.3 0.3 - 1.2 mg/dL   GFR calc non Af Amer >60 >60 mL/min   GFR calc Af Amer >60 >60 mL/min   Anion gap 12 5 - 15  Urinalysis, Routine w reflex microscopic  Result Value Ref Range   Color, Urine STRAW (A) YELLOW   APPearance CLEAR CLEAR   Specific Gravity, Urine 1.027 1.005 - 1.030   pH 6.0 5.0 - 8.0   Glucose, UA >=500 (A) NEGATIVE mg/dL   Hgb urine dipstick LARGE (A) NEGATIVE   Bilirubin Urine NEGATIVE NEGATIVE   Ketones, ur NEGATIVE NEGATIVE mg/dL   Protein, ur NEGATIVE NEGATIVE mg/dL   Nitrite NEGATIVE NEGATIVE   Leukocytes,Ua NEGATIVE NEGATIVE   RBC / HPF >50 (H) 0 - 5 RBC/hpf   WBC, UA 0-5 0 - 5 WBC/hpf   Bacteria, UA RARE (A) NONE SEEN   Squamous Epithelial / LPF 0-5 0 -  5  CBG monitoring, ED  Result Value Ref Range   Glucose-Capillary 433 (H) 70 - 99 mg/dL  I-Stat Beta hCG blood,  ED ([facility], [facility], [facility] only)  Result Value Ref Range   I-stat hCG, quantitative <5.0 <5 mIU/mL   Comment 3           Ct Head Wo Contrast  Result Date: 12/24/2018 CLINICAL DATA:  Piece of metal fell on head. EXAM: CT HEAD WITHOUT CONTRAST TECHNIQUE: Contiguous axial images were obtained from the base of the skull through the vertex without intravenous contrast. COMPARISON:  None. FINDINGS: Brain: No evidence of acute infarction, hemorrhage, hydrocephalus, extra-axial collection or mass lesion/mass effect. Vascular: No hyperdense vessel or unexpected calcification. Skull: Normal. Negative for fracture or focal lesion. Sinuses/Orbits: No acute finding. Other: Tiny 2 mm soft tissue debris involving the high left frontal scalp series 5/9. No significant soft tissue swelling. IMPRESSION: No acute intracranial abnormality.  Tiny left frontal scalp debris. Electronically Signed   By: Tollie Eth M.D.   On: 12/24/2018 17:02    EKG None  Radiology No results found.  Procedures .Marland KitchenIncision and Drainage Date/Time: 01/11/2019 4:11 AM Performed by: Cy Blamer, MD Authorized by: Cy Blamer, MD   Consent:    Consent obtained:  Verbal   Consent given by:  Patient   Risks discussed:  Bleeding, damage to other organs, infection, incomplete drainage and pain   Alternatives discussed:  No treatment Location:    Type:  Abscess   Size:  2 cm   Location:  Anogenital   Anogenital location: buttock. Pre-procedure details:    Skin preparation:  Antiseptic wash, Betadine and Chloraprep Anesthesia (see MAR for exact dosages):    Anesthesia method:  Topical application and local infiltration   Topical anesthetic:  LET   Local anesthetic:  Lidocaine 1% WITH epi Procedure type:    Complexity:  Complex Procedure details:    Needle aspiration: no     Incision types:  Single straight   Incision depth:  Submucosal   Scalpel blade:  11   Wound management:  Probed and deloculated, irrigated with saline and  extensive cleaning   Drainage:  Bloody and purulent (serous that looked like urine)   Drainage amount:  Moderate   Wound treatment:  Drain placed   Packing materials:  1/4 in iodoform gauze   Amount 1/4" iodoform:  3 cm Post-procedure details:    Patient tolerance of procedure:  Tolerated well, no immediate complications   (including critical care time)  Medications Ordered in ED Medications  lidocaine-EPINEPHrine-tetracaine (LET) solution (3 mLs Topical Given 01/11/19 0234)  lidocaine-EPINEPHrine (XYLOCAINE W/EPI) 2 %-1:200000 (PF) injection 20 mL (20 mLs Intradermal Given 01/11/19 0332)  ibuprofen (ADVIL,MOTRIN) tablet 800 mg (800 mg Oral Given 01/11/19 0332)  metFORMIN (GLUCOPHAGE) tablet 850 mg (850 mg Oral Given 01/11/19 0332)  cephALEXin (KEFLEX) capsule 500 mg (500 mg Oral Given 01/11/19 0411)  doxycycline (VIBRA-TABS) tablet 100 mg (100 mg Oral Given 01/11/19 0412)  traMADol (ULTRAM) tablet 50 mg (50 mg Oral Given 01/11/19 0411)       Final Clinical Impressions(s) / ED Diagnoses   Final diagnoses:  Abscess   Will need to return in 2 days for wound check and packing removal.  Take all antibiotics.   Return for pain, intractable cough, fevers >100.4 unrelieved by medication, shortness of breath, intractable vomiting, chest pain, shortness of breath, weakness numbness, changes in speech, facial asymmetry,abdominal pain, passing out,Inability to tolerate liquids or food, cough, altered mental status  or any concerns. No signs of systemic illness or infection. The patient is nontoxic-appearing on exam and vital signs are within normal limits.   I have reviewed the triage vital signs and the nursing notes. Pertinent labs &imaging results that were available during my care of the patient were reviewed by me and considered in my medical decision making (see chart for details).  After history, exam, and medical workup I feel the patient has been appropriately medically screened  and is safe for discharge home. Pertinent diagnoses were discussed with the patient. Patient was given return precautions.    Biana Haggar, MD 01/11/19 [phone]

## 2019-01-14 ENCOUNTER — Other Ambulatory Visit: Payer: Self-pay

## 2019-01-14 ENCOUNTER — Inpatient Hospital Stay (HOSPITAL_COMMUNITY)
Admission: EM | Admit: 2019-01-14 | Discharge: 2019-01-16 | DRG: 603 | Disposition: A | Discharge status: Home / Self Care | Payer: Self-pay | Attending: Internal Medicine | Admitting: Internal Medicine

## 2019-01-14 ENCOUNTER — Encounter (HOSPITAL_COMMUNITY): Payer: Self-pay | Admitting: Emergency Medicine

## 2019-01-14 ENCOUNTER — Emergency Department (HOSPITAL_COMMUNITY): Payer: Self-pay

## 2019-01-14 DIAGNOSIS — Z79899 Other long term (current) drug therapy: Secondary | ICD-10-CM

## 2019-01-14 DIAGNOSIS — Z23 Encounter for immunization: Secondary | ICD-10-CM

## 2019-01-14 DIAGNOSIS — Z791 Long term (current) use of non-steroidal anti-inflammatories (NSAID): Secondary | ICD-10-CM

## 2019-01-14 DIAGNOSIS — L0291 Cutaneous abscess, unspecified: Secondary | ICD-10-CM

## 2019-01-14 DIAGNOSIS — L0231 Cutaneous abscess of buttock: Secondary | ICD-10-CM

## 2019-01-14 DIAGNOSIS — R739 Hyperglycemia, unspecified: Secondary | ICD-10-CM

## 2019-01-14 DIAGNOSIS — Z7984 Long term (current) use of oral hypoglycemic drugs: Secondary | ICD-10-CM

## 2019-01-14 DIAGNOSIS — L03317 Cellulitis of buttock: Secondary | ICD-10-CM | POA: Diagnosis present

## 2019-01-14 DIAGNOSIS — Z833 Family history of diabetes mellitus: Secondary | ICD-10-CM

## 2019-01-14 DIAGNOSIS — Z9049 Acquired absence of other specified parts of digestive tract: Secondary | ICD-10-CM

## 2019-01-14 DIAGNOSIS — E1169 Type 2 diabetes mellitus with other specified complication: Secondary | ICD-10-CM

## 2019-01-14 DIAGNOSIS — E1165 Type 2 diabetes mellitus with hyperglycemia: Secondary | ICD-10-CM | POA: Diagnosis present

## 2019-01-14 HISTORY — DX: Cutaneous abscess of buttock: L02.31

## 2019-01-14 HISTORY — DX: Cutaneous abscess, unspecified: L02.91

## 2019-01-14 LAB — CBC WITH DIFFERENTIAL/PLATELET
ABS IMMATURE GRANULOCYTES: 0.03 10*3/uL (ref 0.00–0.07)
BASOS PCT: 0 %
Basophils Absolute: 0 10*3/uL (ref 0.0–0.1)
Eosinophils Absolute: 0.4 10*3/uL (ref 0.0–0.5)
Eosinophils Relative: 4 %
HCT: 40 % (ref 36.0–46.0)
Hemoglobin: 13.5 g/dL (ref 12.0–15.0)
Immature Granulocytes: 0 %
Lymphocytes Relative: 22 %
Lymphs Abs: 2 10*3/uL (ref 0.7–4.0)
MCH: 28.5 pg (ref 26.0–34.0)
MCHC: 33.8 g/dL (ref 30.0–36.0)
MCV: 84.6 fL (ref 80.0–100.0)
Monocytes Absolute: 0.8 10*3/uL (ref 0.1–1.0)
Monocytes Relative: 8 %
NEUTROS ABS: 5.8 10*3/uL (ref 1.7–7.7)
Neutrophils Relative %: 66 %
Platelets: 457 10*3/uL — ABNORMAL HIGH (ref 150–400)
RBC: 4.73 MIL/uL (ref 3.87–5.11)
RDW: 12 % (ref 11.5–15.5)
WBC: 9 10*3/uL (ref 4.0–10.5)
nRBC: 0 % (ref 0.0–0.2)

## 2019-01-14 LAB — URINALYSIS, ROUTINE W REFLEX MICROSCOPIC
Bilirubin Urine: NEGATIVE
Glucose, UA: >=500 mg/dL — AB
KETONES UR: NEGATIVE mg/dL
Leukocytes,Ua: NEGATIVE
Nitrite: NEGATIVE
PROTEIN: NEGATIVE mg/dL
Specific Gravity, Urine: 1.036 — ABNORMAL HIGH (ref 1.005–1.030)
pH: 6 (ref 5.0–8.0)

## 2019-01-14 LAB — COMPREHENSIVE METABOLIC PANEL
ALT: 17 U/L (ref 0–44)
AST: 35 U/L (ref 15–41)
Albumin: 3.6 g/dL (ref 3.5–5.0)
Alkaline Phosphatase: 111 U/L (ref 38–126)
Anion gap: 4 — ABNORMAL LOW (ref 5–15)
BUN: 9 mg/dL (ref 6–20)
CO2: 31 mmol/L (ref 22–32)
Calcium: 9.1 mg/dL (ref 8.9–10.3)
Chloride: 97 mmol/L — ABNORMAL LOW (ref 98–111)
Creatinine, Ser: 0.52 mg/dL (ref 0.44–1.00)
GFR calc Af Amer: >60 mL/min (ref >60)
Glucose, Bld: 402 mg/dL — ABNORMAL HIGH (ref 70–99)
Potassium: 4.4 mmol/L (ref 3.5–5.1)
Sodium: 132 mmol/L — ABNORMAL LOW (ref 135–145)
Total Bilirubin: 1.9 mg/dL — ABNORMAL HIGH (ref 0.3–1.2)
Total Protein: 7.1 g/dL (ref 6.5–8.1)

## 2019-01-14 LAB — GLUCOSE, CAPILLARY
Glucose-Capillary: 149 mg/dL — ABNORMAL HIGH (ref 70–99)
Glucose-Capillary: 281 mg/dL — ABNORMAL HIGH (ref 70–99)

## 2019-01-14 LAB — LACTIC ACID, PLASMA
Lactic Acid, Venous: 2.2 mmol/L (ref 0.5–1.9)
Lactic Acid, Venous: 1.1 mmol/L (ref 0.5–1.9)

## 2019-01-14 LAB — CBG MONITORING, ED: Glucose-Capillary: 391 mg/dL — ABNORMAL HIGH (ref 70–99)

## 2019-01-14 MED ORDER — ENOXAPARIN SODIUM 40 MG/0.4ML ~~LOC~~ SOLN
40.0000 mg | SUBCUTANEOUS | Status: DC
  Administered 2019-01-14 – 2019-01-15 (×2): 40 mg via SUBCUTANEOUS
  Filled 2019-01-14 (×2): qty 0.4

## 2019-01-14 MED ORDER — SODIUM CHLORIDE 0.9 % IV SOLN
2.0000 g | INTRAVENOUS | Status: DC
  Administered 2019-01-14 – 2019-01-16 (×3): 2 g via INTRAVENOUS
  Filled 2019-01-14 (×3): qty 20

## 2019-01-14 MED ORDER — IOHEXOL 300 MG/ML  SOLN
100.0000 mL | Freq: Once | INTRAMUSCULAR | Status: AC | PRN
  Administered 2019-01-14: 100 mL via INTRAVENOUS

## 2019-01-14 MED ORDER — VANCOMYCIN HCL IN DEXTROSE 1-5 GM/200ML-% IV SOLN
1000.0000 mg | Freq: Once | INTRAVENOUS | Status: AC
  Administered 2019-01-14: 1000 mg via INTRAVENOUS
  Filled 2019-01-14: qty 200

## 2019-01-14 MED ORDER — LIDOCAINE-EPINEPHRINE (PF) 2 %-1:200000 IJ SOLN
10.0000 mL | Freq: Once | INTRAMUSCULAR | Status: AC
  Administered 2019-01-14: 10 mL
  Filled 2019-01-14: qty 20

## 2019-01-14 MED ORDER — METRONIDAZOLE IN NACL 5-0.79 MG/ML-% IV SOLN
500.0000 mg | Freq: Three times a day (TID) | INTRAVENOUS | Status: DC
  Administered 2019-01-14 – 2019-01-16 (×6): 500 mg via INTRAVENOUS
  Filled 2019-01-14 (×6): qty 100

## 2019-01-14 MED ORDER — INSULIN ASPART 100 UNIT/ML ~~LOC~~ SOLN
8.0000 [IU] | Freq: Once | SUBCUTANEOUS | Status: AC
  Administered 2019-01-14: 8 [IU] via INTRAVENOUS

## 2019-01-14 MED ORDER — INSULIN ASPART 100 UNIT/ML ~~LOC~~ SOLN
0.0000 [IU] | Freq: Every day | SUBCUTANEOUS | Status: DC
  Administered 2019-01-14: 3 [IU] via SUBCUTANEOUS

## 2019-01-14 MED ORDER — INFLUENZA VAC SPLIT QUAD 0.5 ML IM SUSY
0.5000 mL | PREFILLED_SYRINGE | INTRAMUSCULAR | Status: AC
  Administered 2019-01-16: 0.5 mL via INTRAMUSCULAR
  Filled 2019-01-14: qty 0.5

## 2019-01-14 MED ORDER — INSULIN ASPART 100 UNIT/ML ~~LOC~~ SOLN
0.0000 [IU] | Freq: Three times a day (TID) | SUBCUTANEOUS | Status: DC
  Administered 2019-01-14: 1 [IU] via SUBCUTANEOUS
  Administered 2019-01-15: 3 [IU] via SUBCUTANEOUS
  Administered 2019-01-15: 2 [IU] via SUBCUTANEOUS
  Administered 2019-01-15: 3 [IU] via SUBCUTANEOUS
  Administered 2019-01-16: 2 [IU] via SUBCUTANEOUS
  Administered 2019-01-16: 3 [IU] via SUBCUTANEOUS
  Administered 2019-01-16: 2 [IU] via SUBCUTANEOUS

## 2019-01-14 MED ORDER — SODIUM CHLORIDE 0.9 % IV SOLN
INTRAVENOUS | Status: DC
  Administered 2019-01-14 – 2019-01-16 (×4): via INTRAVENOUS

## 2019-01-14 MED ORDER — METFORMIN HCL 500 MG PO TABS
500.0000 mg | ORAL_TABLET | Freq: Two times a day (BID) | ORAL | Status: DC
  Administered 2019-01-14 – 2019-01-16 (×5): 500 mg via ORAL
  Filled 2019-01-14 (×5): qty 1

## 2019-01-14 MED ORDER — VANCOMYCIN HCL 10 G IV SOLR
1250.0000 mg | INTRAVENOUS | Status: DC
  Administered 2019-01-15 – 2019-01-16 (×2): 1250 mg via INTRAVENOUS
  Filled 2019-01-14 (×2): qty 1250

## 2019-01-14 MED ORDER — SODIUM CHLORIDE 0.9 % IV BOLUS
500.0000 mL | Freq: Once | INTRAVENOUS | Status: AC
  Administered 2019-01-14: 500 mL via INTRAVENOUS

## 2019-01-14 MED ORDER — METRONIDAZOLE IN NACL 5-0.79 MG/ML-% IV SOLN
500.0000 mg | Freq: Once | INTRAVENOUS | Status: AC
  Administered 2019-01-14: 500 mg via INTRAVENOUS
  Filled 2019-01-14: qty 100

## 2019-01-14 MED ORDER — PNEUMOCOCCAL VAC POLYVALENT 25 MCG/0.5ML IJ INJ
0.5000 mL | INJECTION | INTRAMUSCULAR | Status: AC
  Administered 2019-01-16: 0.5 mL via INTRAMUSCULAR
  Filled 2019-01-14: qty 0.5

## 2019-01-14 NOTE — Progress Notes (Signed)
Pharmacy Antibiotic Note  Angelica [name] is a 33 y.o. female admitted on 01/14/2019 with cellulitis. Pharmacy has been consulted for vancomycin dosing. Pt is afebrile and WBC is WNL. Scr is WNL.   Plan: Vancomycin 1gm IV x 1 then 1250mg  IV Q24H F/u renal fxn, C&S, clinical status and peak/trough at SS   Temp (24hrs), Avg:98.3 F (36.8 C), Min:98.3 F (36.8 C), Max:98.3 F (36.8 C)  Recent Labs  Lab 01/10/19 2216 01/14/19 1147  WBC 7.8 9.0  CREATININE 0.53  --     CrCl cannot be calculated (Unknown ideal weight.).    No Known Allergies  Antimicrobials this admission: Vanc 3/3>> CTX 3/3>>  Dose adjustments this admission: N/A  Microbiology results: Pending  Thank you for allowing pharmacy to be a part of this patient's care.  Karver Fadden, Drake Leach 01/14/2019 12:06 PM

## 2019-01-14 NOTE — Plan of Care (Signed)
  Problem: Education: Goal: Knowledge of General Education information will improve Description: Including pain rating scale, medication(s)/side effects and non-pharmacologic comfort measures Outcome: Progressing   Problem: Clinical Measurements: Goal: Ability to maintain clinical measurements within normal limits will improve Outcome: Progressing Goal: Will remain free from infection Outcome: Progressing Goal: Diagnostic test results will improve Outcome: Progressing   Problem: Activity: Goal: Risk for activity intolerance will decrease Outcome: Progressing   Problem: Nutrition: Goal: Adequate nutrition will be maintained Outcome: Progressing   Problem: Pain Managment: Goal: General experience of comfort will improve Outcome: Progressing   

## 2019-01-14 NOTE — ED Provider Notes (Signed)
Emergency Department Provider Note   I have reviewed the triage vital signs and the nursing notes.   HISTORY  Chief Complaint Wound Check   HPI Angelica [name] is a 33 y.o. female with PMH of poorly controlled DM presents to the emergency department for evaluation of left gluteal wound.  This was evaluated and drained with packing placed on 2/28.  Patient reports injecting urine into her buttock in an attempt to control her diabetes.  She reports this is a home remedy and [place].  She had redness and swelling which prompted initial ED visit.  She had the area drained and packing placed.  She has been cleaning around the wound.  She denies pain in the area but noticed continued redness and drainage.  No fevers.  She was able to fill her doxycycline but not the Keflex.  Has been taking that over the past several days.   History obtained using an in-person interpreter.   Past Medical History:  Diagnosis Date  . Abscess 01/14/2019   gluteal   . Diabetes mellitus without complication [facility])     Patient Active Problem List   Diagnosis Date Noted  . Abscess and cellulitis of gluteal region 01/14/2019    Past Surgical History:  Procedure Laterality Date  . CESAREAN SECTION    . CHOLECYSTECTOMY      Allergies Patient has no known allergies.  History reviewed. No pertinent family history.  Social History Social History   Tobacco Use  . Smoking status: Never Smoker  . Smokeless tobacco: Never Used  Substance Use Topics  . Alcohol use: Never    Frequency: Never  . Drug use: Never    Review of Systems  Constitutional: No fever/chills Eyes: No visual changes. ENT: No sore throat. Cardiovascular: Denies chest pain. Respiratory: Denies shortness of breath. Gastrointestinal: No abdominal pain.  No nausea, no vomiting.  No diarrhea.  No constipation. Genitourinary: Negative for dysuria. Musculoskeletal: Negative for back pain. Skin: Left gluteal swelling,  redness, and drainage.  Neurological: Negative for headaches, focal weakness or numbness.  10-point ROS otherwise negative.  ____________________________________________   PHYSICAL EXAM:  VITAL SIGNS: ED Triage Vitals  Enc Vitals Group     BP 01/14/19 1128 106/68     Pulse Rate 01/14/19 1128 87     Resp 01/14/19 1128 16     Temp 01/14/19 1128 98.3 F (36.8 C)     Temp Source 01/14/19 1128 Oral     SpO2 01/14/19 1128 100 %     Pain Score 01/14/19 1129 5   Constitutional: Alert and oriented. Well appearing and in no acute distress. Eyes: Conjunctivae are normal.  Head: Atraumatic. Nose: No congestion/rhinnorhea. Mouth/Throat: Mucous membranes are moist.  Neck: No stridor.   Cardiovascular: Normal rate, regular rhythm. Good peripheral circulation. Grossly normal heart sounds.   Respiratory: Normal respiratory effort.  No retractions. Lungs CTAB. Gastrointestinal: Soft and nontender. No distention.  Musculoskeletal: No lower extremity tenderness nor edema. No gross deformities of extremities. Neurologic:  Normal speech and language. No gross focal neurologic deficits are appreciated.  Skin: 1 cm incision with packing in place. Surrounding erythema decreased compared to prior line drawn.  Patient with significant induration and copious, foul-smelling pus expelled after removing the packing.   ____________________________________________   LABS (all labs ordered are listed, but only abnormal results are displayed)  Labs Reviewed  COMPREHENSIVE METABOLIC PANEL - Abnormal; Notable for the following components:      Result Value   Sodium 132 (*)  Chloride 97 (*)    Glucose, Bld 402 (*)    Total Bilirubin 1.9 (*)    Anion gap 4 (*)    All other components within normal limits  CBC WITH DIFFERENTIAL/PLATELET - Abnormal; Notable for the following components:   Platelets 457 (*)    All other components within normal limits  LACTIC ACID, PLASMA - Abnormal; Notable for the  following components:   Lactic Acid, Venous 2.2 (*)    All other components within normal limits  URINALYSIS, ROUTINE W REFLEX MICROSCOPIC - Abnormal; Notable for the following components:   Specific Gravity, Urine 1.036 (*)    Glucose, UA >=500 (*)    Hgb urine dipstick MODERATE (*)    Bacteria, UA RARE (*)    All other components within normal limits  BASIC METABOLIC PANEL - Abnormal; Notable for the following components:   Glucose, Bld 238 (*)    Creatinine, Ser 0.42 (*)    Calcium 8.5 (*)    All other components within normal limits  GLUCOSE, CAPILLARY - Abnormal; Notable for the following components:   Glucose-Capillary 149 (*)    All other components within normal limits  GLUCOSE, CAPILLARY - Abnormal; Notable for the following components:   Glucose-Capillary 281 (*)    All other components within normal limits  GLUCOSE, CAPILLARY - Abnormal; Notable for the following components:   Glucose-Capillary 212 (*)    All other components within normal limits  CBG MONITORING, ED - Abnormal; Notable for the following components:   Glucose-Capillary 391 (*)    All other components within normal limits  CULTURE, BLOOD (ROUTINE X 2)  CULTURE, BLOOD (ROUTINE X 2)  LACTIC ACID, PLASMA  HIV ANTIBODY (ROUTINE TESTING W REFLEX)   ____________________________________________  RADIOLOGY  Ct Pelvis W Contrast  Result Date: 01/14/2019 CLINICAL DATA:  Rectal pain. Left gluteal abscess. Evaluate for perirectal abscess. EXAM: CT PELVIS WITH CONTRAST TECHNIQUE: Multidetector CT imaging of the pelvis was performed using the standard protocol following the bolus administration of intravenous contrast. CONTRAST:  OMNIPAQUE IOHEXOL 300 MG/ML  SOLN COMPARISON:  None. FINDINGS: Urinary Tract:  Visualized ureters and bladder are normal. Bowel:  Visualized small bowel, appendix and colon are normal. Vascular/Lymphatic: Normal. Reproductive:  Normal. Other: No free pelvic fluid. 2.5 x 4.2 cm fluid  collection within the subcutaneous fat of the left gluteal region containing a few small foci of air compatible with known gluteal abscess. High density material within the medial aspect of this fluid collection which extends in a linear fashion to the skin surface likely representing packing material. This inflammatory/infectious process extends to the superficial surface over the gluteal muscle. No evidence of extension of this inflammatory/infectious processes to the perirectal or perianal region. The subcutaneous edema does extend to the midline. There is mild inflammatory change adjacent the coccyx. Musculoskeletal: Normal. IMPRESSION: Evidence patient's known left gluteal subcutaneous abscess containing high-density material extending to the skin surface likely packing material. This collection measures 2.5 x 4.2 cm. There is no definite extension to the perirectal/perianal region. Subtle stranding of the fat adjacent the coccyx as the subcutaneous edema may extend to the midline adjacent the coccyx. Electronically Signed   By: Elberta Fortis M.D.   On: 01/14/2019 13:46    ____________________________________________   PROCEDURES  Procedure(s) performed:   Marland KitchenMarland KitchenIncision and Drainage Date/Time: 01/15/2019 10:24 AM Performed by: Maia Plan, MD Authorized by: Maia Plan, MD   Consent:    Consent obtained:  Verbal   Consent given  by:  Patient   Risks discussed:  Bleeding, damage to other organs, infection, incomplete drainage and pain Location:    Type:  Abscess   Size:  8 cm    Location:  Lower extremity   Lower extremity location:  Buttock   Buttock location:  L buttock Pre-procedure details:    Skin preparation:  Betadine Anesthesia (see MAR for exact dosages):    Anesthesia method:  Local infiltration   Local anesthetic:  Lidocaine 2% WITH epi Procedure type:    Complexity:  Complex Procedure details:    Incision types:  Single straight   Incision depth:  Subcutaneous    Scalpel blade:  11   Wound management:  Probed and deloculated, extensive cleaning and irrigated with saline   Drainage:  Purulent   Drainage amount:  Copious   Wound treatment:  Wound left open   Packing materials:  1/2 in iodoform gauze   Amount 1/2" iodoform:  24 inches Post-procedure details:    Patient tolerance of procedure:  Tolerated well, no immediate complications .Critical Care Performed by: Maia Plan, MD Authorized by: Maia Plan, MD   Critical care provider statement:    Critical care time (minutes):  45   Critical care time was exclusive of:  Separately billable procedures and treating other patients and teaching time   Critical care was necessary to treat or prevent imminent or life-threatening deterioration of the following conditions:  Sepsis   Critical care was time spent personally by me on the following activities:  Discussions with consultants, evaluation of patient's response to treatment, examination of patient, ordering and performing treatments and interventions, ordering and review of laboratory studies, ordering and review of radiographic studies, pulse oximetry, re-evaluation of patient's condition, obtaining history from patient or surrogate, review of old charts and development of treatment plan with patient or surrogate   I assumed direction of critical care for this patient from another provider in my specialty: no       ____________________________________________   INITIAL IMPRESSION / ASSESSMENT AND PLAN / ED COURSE  Pertinent labs & imaging results that were available during my care of the patient were reviewed by me and considered in my medical decision making (see chart for details).  Patient with large gluteal abscess and copious pus/induration on removal of packing at bedside.  I plan to extend the incision and clean/pack the wound again.  Patient's wound remains poorly treated at home with oral antibiotics.  Patient not controlling her  blood sugars well.  Plan for CT of the pelvis and IV antibiotics.  Patient will likely require admission for antibiotics and blood sugar control.  CT reviewed with no deep extension of the abscess into musculature or other underlying structures.  Plan for antibiotics and blood sugar control overnight.  Consider surgery consultation if symptoms worsen for OR cleanout but feel that a good incision and drainage was performed at the bedside here.  Discussed patient's case with Hospitalist to request admission. Patient and family (if present) updated with plan. Care transferred to Hospitalist service.  I reviewed all nursing notes, vitals, pertinent old records, EKGs, labs, imaging (as available).  ____________________________________________  FINAL CLINICAL IMPRESSION(S) / ED DIAGNOSES  Final diagnoses:  Abscess  Hyperglycemia     MEDICATIONS GIVEN DURING THIS VISIT:  Medications  cefTRIAXone (ROCEPHIN) 2 g in sodium chloride 0.9 % 100 mL IVPB (0 g Intravenous Stopped 01/14/19 1444)  vancomycin (VANCOCIN) 1,250 mg in sodium chloride 0.9 % 250 mL IVPB (has no  administration in time range)  enoxaparin (LOVENOX) injection 40 mg (40 mg Subcutaneous Given 01/14/19 1718)  insulin aspart (novoLOG) injection 0-9 Units (3 Units Subcutaneous Given 01/15/19 0846)  insulin aspart (novoLOG) injection 0-5 Units (3 Units Subcutaneous Given 01/14/19 2143)  metroNIDAZOLE (FLAGYL) IVPB 500 mg (500 mg Intravenous New Bag/Given 01/15/19 0559)  metFORMIN (GLUCOPHAGE) tablet 500 mg (500 mg Oral Given 01/15/19 0846)  0.9 %  sodium chloride infusion ( Intravenous New Bag/Given 01/15/19 0559)  Influenza vac split quadrivalent PF (FLUARIX) injection 0.5 mL (has no administration in time range)  pneumococcal 23 valent vaccine (PNU-IMMUNE) injection 0.5 mL (has no administration in time range)  sodium chloride 0.9 % bolus 500 mL (500 mLs Intravenous New Bag/Given 01/14/19 1201)  vancomycin (VANCOCIN) IVPB 1000 mg/200 mL premix (0  mg Intravenous Stopped 01/14/19 1325)  lidocaine-EPINEPHrine (XYLOCAINE W/EPI) 2 %-1:200000 (PF) injection 10 mL (10 mLs Infiltration Given 01/14/19 1215)  iohexol (OMNIPAQUE) 300 MG/ML solution 100 mL (100 mLs Intravenous Contrast Given 01/14/19 1319)  insulin aspart (novoLOG) injection 8 Units (8 Units Intravenous Given 01/14/19 1438)  metroNIDAZOLE (FLAGYL) IVPB 500 mg (500 mg Intravenous New Bag/Given 01/14/19 1442)  sodium chloride 0.9 % bolus 500 mL (500 mLs Intravenous New Bag/Given 01/14/19 1800)  sodium chloride 0.9 % bolus 1,000 mL (1,000 mLs Intravenous New Bag/Given 01/15/19 0859)    Note:  This document was prepared using Dragon voice recognition software and may include unintentional dictation errors.  Alona Bene, MD Emergency Medicine    Simya Tercero, Arlyss Repress, MD 01/15/19 1027

## 2019-01-14 NOTE — Plan of Care (Signed)
  Problem: Education: Goal: Knowledge of General Education information will improve Description Including pain rating scale, medication(s)/side effects and non-pharmacologic comfort measures Outcome: Progressing   

## 2019-01-14 NOTE — ED Triage Notes (Addendum)
Here for wound recheck left buttock, that was tx and packed, on 2/28 states has notinjected any more urine, got doxy filled and naproxen filled, states [facility] would not fill the keflex rx

## 2019-01-14 NOTE — ED Notes (Signed)
ED TO INPATIENT HANDOFF REPORT  ED Nurse Name and Phone #: CINDY Jaleen Grupp [phone]  S Name/Age/Gender Angelica [name] 33 y.o. female Room/Bed: 013C/013C  Code Status   Code Status: Not on file  Home/SNF/Other Home Patient oriented to: self, place, time and situation Is this baseline? Yes   Triage Complete: Triage complete  Chief Complaint wound check  Triage Note Here for wound recheck left buttock, that was tx and packed, on 2/28 states has notinjected any more urine, got doxy filled and naproxen filled, states [facility] would not fill the keflex rx   Allergies No Known Allergies  Level of Care/Admitting Diagnosis ED Disposition    ED Disposition Condition Comment   Admit  [facility] [[id number]]  Level of Care: Med-Surg [16]  Diagnosis: Abscess and cellulitis of gluteal region [[id number]][[id number]]  Admitting Physician: Carron CurieHIJAZI, ALI [[id number]]  Attending Physician: Sharyon MedicusHIJAZI, ALI Bai.Lain[[id number]]  Estimated length of stay: past midnight tomorrow  Certification:: I certify this patient will need inpatient services for at least 2 midnights  PT Class (Do Not Modify): Inpatient [101]  PT Acc Code (Do Not Modify): Private [1]       B Medical/Surgery History Past Medical History:  Diagnosis Date  . Diabetes mellitus without complication (HCC)    History reviewed. No pertinent surgical history.   A IV Location/Drains/Wounds Patient Lines/Drains/Airways Status   Active Line/Drains/Airways    Name:   Placement date:   Placement time:   Site:   Days:   Peripheral IV 01/14/19 Right Antecubital   01/14/19    1146    Antecubital   less than 1          Intake/Output Last 24 hours No intake or output data in the 24 hours ending 01/14/19 1528  Labs/Imaging Results for orders placed or performed during the hospital encounter of 01/14/19 (from the past 48 hour(s))  CBG monitoring, ED     Status: Abnormal   Collection Time: 01/14/19 11:36 AM  Result Value Ref  Range   Glucose-Capillary 391 (H) 70 - 99 mg/dL  Comprehensive metabolic panel     Status: Abnormal   Collection Time: 01/14/19 11:47 AM  Result Value Ref Range   Sodium 132 (L) 135 - 145 mmol/L   Potassium 4.4 3.5 - 5.1 mmol/L    Comment: SLIGHT HEMOLYSIS   Chloride 97 (L) 98 - 111 mmol/L   CO2 31 22 - 32 mmol/L   Glucose, Bld 402 (H) 70 - 99 mg/dL   BUN 9 6 - 20 mg/dL   Creatinine, Ser 9.810.52 0.44 - 1.00 mg/dL   Calcium 9.1 8.9 - 19.110.3 mg/dL   Total Protein 7.1 6.5 - 8.1 g/dL   Albumin 3.6 3.5 - 5.0 g/dL   AST 35 15 - 41 U/L   ALT 17 0 - 44 U/L   Alkaline Phosphatase 111 38 - 126 U/L   Total Bilirubin 1.9 (H) 0.3 - 1.2 mg/dL   GFR calc non Af Amer >60 >60 mL/min   GFR calc Af Amer >60 >60 mL/min   Anion gap 4 (L) 5 - 15    Comment: Performed at [facility], [address] [place], [place] [address]  CBC with Differential     Status: Abnormal   Collection Time: 01/14/19 11:47 AM  Result Value Ref Range   WBC 9.0 4.0 - 10.5 K/uL   RBC 4.73 3.87 - 5.11 MIL/uL   Hemoglobin 13.5 12.0 - 15.0 g/dL   HCT 40.0  36.0 - 46.0 %   MCV 84.6 80.0 - 100.0 fL   MCH 28.5 26.0 - 34.0 pg   MCHC 33.8 30.0 - 36.0 g/dL   RDW 75.7 97.2 - 82.0 %   Platelets 457 (H) 150 - 400 K/uL   nRBC 0.0 0.0 - 0.2 %   Neutrophils Relative % 66 %   Neutro Abs 5.8 1.7 - 7.7 K/uL   Lymphocytes Relative 22 %   Lymphs Abs 2.0 0.7 - 4.0 K/uL   Monocytes Relative 8 %   Monocytes Absolute 0.8 0.1 - 1.0 K/uL   Eosinophils Relative 4 %   Eosinophils Absolute 0.4 0.0 - 0.5 K/uL   Basophils Relative 0 %   Basophils Absolute 0.0 0.0 - 0.1 K/uL   Immature Granulocytes 0 %   Abs Immature Granulocytes 0.03 0.00 - 0.07 K/uL    Comment: Performed at [facility], [address] [place], [place] [address]  Lactic acid, plasma     Status: Abnormal   Collection Time: 01/14/19 11:47 AM  Result Value Ref Range   Lactic Acid, Venous 2.2 (HH) 0.5 - 1.9 mmol/L    Comment: CRITICAL RESULT CALLED TO, READ BACK BY  AND VERIFIED WITH: A JASPER,RN AT 1354 01/14/2019 BY L BENFIELD Performed at [facility], [address] [place], [place] [address]   Urinalysis, Routine w reflex microscopic     Status: Abnormal   Collection Time: 01/14/19 11:48 AM  Result Value Ref Range   Color, Urine YELLOW YELLOW   APPearance CLEAR CLEAR   Specific Gravity, Urine 1.036 (H) 1.005 - 1.030   pH 6.0 5.0 - 8.0   Glucose, UA >=500 (A) NEGATIVE mg/dL   Hgb urine dipstick MODERATE (A) NEGATIVE   Bilirubin Urine NEGATIVE NEGATIVE   Ketones, ur NEGATIVE NEGATIVE mg/dL   Protein, ur NEGATIVE NEGATIVE mg/dL   Nitrite NEGATIVE NEGATIVE   Leukocytes,Ua NEGATIVE NEGATIVE   RBC / HPF 21-50 0 - 5 RBC/hpf   WBC, UA 0-5 0 - 5 WBC/hpf   Bacteria, UA RARE (A) NONE SEEN   Squamous Epithelial / LPF 0-5 0 - 5   Mucus PRESENT     Comment: Performed at [facility], [address], [place], [place] [address]   Ct Pelvis W Contrast  Result Date: 01/14/2019 CLINICAL DATA:  Rectal pain. Left gluteal abscess. Evaluate for perirectal abscess. EXAM: CT PELVIS WITH CONTRAST TECHNIQUE: Multidetector CT imaging of the pelvis was performed using the standard protocol following the bolus administration of intravenous contrast. CONTRAST:  OMNIPAQUE IOHEXOL 300 MG/ML  SOLN COMPARISON:  None. FINDINGS: Urinary Tract:  Visualized ureters and bladder are normal. Bowel:  Visualized small bowel, appendix and colon are normal. Vascular/Lymphatic: Normal. Reproductive:  Normal. Other: No free pelvic fluid. 2.5 x 4.2 cm fluid collection within the subcutaneous fat of the left gluteal region containing a few small foci of air compatible with known gluteal abscess. High density material within the medial aspect of this fluid collection which extends in a linear fashion to the skin surface likely representing packing material. This inflammatory/infectious process extends to the superficial surface over the gluteal muscle. No evidence of extension of  this inflammatory/infectious processes to the perirectal or perianal region. The subcutaneous edema does extend to the midline. There is mild inflammatory change adjacent the coccyx. Musculoskeletal: Normal. IMPRESSION: Evidence patient's known left gluteal subcutaneous abscess containing high-density material extending to the skin surface likely packing material. This collection measures 2.5 x 4.2  cm. There is no definite extension to the perirectal/perianal region. Subtle stranding of the fat adjacent the coccyx as the subcutaneous edema may extend to the midline adjacent the coccyx. Electronically Signed   By: Elberta Fortis M.D.   On: 01/14/2019 13:46    Pending Labs Unresulted Labs (From admission, onward)    Start     Ordered   01/14/19 1138  Lactic acid, plasma  Now then every 2 hours,   STAT     01/14/19 1139   01/14/19 1138  Culture, blood (routine x 2)  BLOOD CULTURE X 2,   STAT     01/14/19 1139   Signed and Held  HIV antibody (Routine Testing)  Once,   R     Signed and Held   Signed and Held  Basic metabolic panel  Tomorrow morning,   R     Signed and Held          Vitals/Pain Today's Vitals   01/14/19 1128 01/14/19 1129 01/14/19 1258  BP: 106/68    Pulse: 87    Resp: 16    Temp: 98.3 F (36.8 C)    TempSrc: Oral    SpO2: 100%    Weight:   54.7 kg  Height:   4' 11.09" (1.501 m)  PainSc:  5      Isolation Precautions No active isolations  Medications Medications  cefTRIAXone (ROCEPHIN) 2 g in sodium chloride 0.9 % 100 mL IVPB (0 g Intravenous Stopped 01/14/19 1444)  vancomycin (VANCOCIN) 1,250 mg in sodium chloride 0.9 % 250 mL IVPB (has no administration in time range)  metroNIDAZOLE (FLAGYL) IVPB 500 mg (500 mg Intravenous New Bag/Given 01/14/19 1442)  sodium chloride 0.9 % bolus 500 mL (500 mLs Intravenous New Bag/Given 01/14/19 1201)  vancomycin (VANCOCIN) IVPB 1000 mg/200 mL premix (0 mg Intravenous Stopped 01/14/19 1325)  lidocaine-EPINEPHrine (XYLOCAINE W/EPI) 2  %-1:200000 (PF) injection 10 mL (10 mLs Infiltration Given 01/14/19 1215)  iohexol (OMNIPAQUE) 300 MG/ML solution 100 mL (100 mLs Intravenous Contrast Given 01/14/19 1319)  insulin aspart (novoLOG) injection 8 Units (8 Units Intravenous Given 01/14/19 1438)    Mobility walks Low fall risk   Focused Assessments    R Recommendations: See Admitting Provider Note  Report given to: 6 NORTH  Additional Notes: PT ONLY SPEAKS SPANISH

## 2019-01-14 NOTE — ED Notes (Signed)
Dr long aware of latic

## 2019-01-14 NOTE — H&P (Signed)
[facility]                                                                                    Patient Demographics  Angelica [name], is a 33 y.o. female  CSN: [id number]  MRN: [id number]  DOB - 06/02/1986  Admit Date - 01/14/2019  Outpatient Primary MD for the patient [facility]   With History of -  Past Medical History:  Diagnosis Date  . Diabetes mellitus without complication (HCC)       History reviewed. No pertinent surgical history.  in for   Chief Complaint  Patient presents with  . Wound Check     HPI  Angelica [name]  is a 33 y.o. female, with past medical history significant for diabetes mellitus type 2 presenting left gluteal wound which was drained and packed 2/28.  Patient was not injecting urine into her wound as a part of treatment.  Her wound worsened with increased swelling and redness and increased drainage. In the emergency room patient underwent I&D by ER provider and was started on IV antibiotics    Review of Systems    In addition to the HPI above,  No Fever-chills, No Headache, No changes with Vision or hearing, No problems swallowing food or Liquids, No Chest pain, Cough or Shortness of Breath, No Abdominal pain, No Nausea or Vommitting, Bowel movements are regular, No Blood in stool or Urine, No dysuria, No new skin rashes or bruises,  No new weakness, tingling, numbness in any extremity, No recent weight gain or loss, No polyuria, polydypsia or polyphagia, No significant Mental Stressors.  A full 10 point Review of Systems was done, except as stated above, all other Review of Systems were negative.   Social History Social History   Tobacco Use  . Smoking status: Never Smoker  . Smokeless tobacco: Never Used  Substance Use Topics  . Alcohol use: Never    Frequency: Never     Family History Diabetes mellitus  Prior to Admission medications   Medication Sig Start Date End  Date Taking? Authorizing Provider  doxycycline (VIBRAMYCIN) 100 MG capsule Take 1 capsule (100 mg total) by mouth 2 (two) times daily. One po bid x 7 days 01/11/19  Yes Palumbo, April, MD  metFORMIN (GLUCOPHAGE) 500 MG tablet Take 1 tablet (500 mg total) by mouth 2 (two) times daily with a meal. 01/11/19  Yes Palumbo, April, MD  naproxen (NAPROSYN) 375 MG tablet Take 1 tablet (375 mg total) by mouth 2 (two) times daily. 01/11/19  Yes Palumbo, April, MD    No Known Allergies  Physical Exam  Vitals  Blood pressure 106/68, pulse 87, temperature 98.3 F (36.8 C), temperature source Oral, resp. rate 16, height 4' 11.09" (1.501 m), weight 54.7 kg, last menstrual period 12/14/2018, SpO2 100 %.   1. General Young, very pleasant  2. Normal affect and insight, Not Suicidal or Homicidal, Awake Alert, Oriented X 3.  3. No F.N deficits, grossly, patient moving all extremities.  4. Ears and Eyes appear Normal, Conjunctivae clear, PERRLA. Moist Oral Mucosa.  5. Supple Neck, No JVD, No cervical lymphadenopathy appriciated, No Carotid  Bruits.  6. Symmetrical Chest wall movement, Good air movement bilaterally, CTAB.  7. RRR, No Gallops, Rubs or Murmurs, No Parasternal Heave.  8. Positive Bowel Sounds, Abdomen Soft, Non tender, No organomegaly appriciated,No rebound -guarding or rigidity.  9.  No Cyanosis, Normal Skin Turgor, No Skin Rash or Bruise.  Left gluteal abscess with intact dressing noted status post I&D  10. Good muscle tone,  joints appear normal , no effusions, Normal ROM.    Data Review  CBC Recent Labs  Lab 01/10/19 2216 01/14/19 1147  WBC 7.8 9.0  HGB 13.3 13.5  HCT 40.4 40.0  PLT 358 457*  MCV 84.7 84.6  MCH 27.9 28.5  MCHC 32.9 33.8  RDW 12.0 12.0  LYMPHSABS 1.9 2.0  MONOABS 0.6 0.8  EOSABS 0.5 0.4  BASOSABS 0.0 0.0   ------------------------------------------------------------------------------------------------------------------  Chemistries  Recent Labs   Lab 01/10/19 2216 01/14/19 1147  NA 133* 132*  K 3.8 4.4  CL 100 97*  CO2 21* 31  GLUCOSE 389* 402*  BUN 9 9  CREATININE 0.53 0.52  CALCIUM 9.3 9.1  AST 19 35  ALT 18 17  ALKPHOS 109 111  BILITOT 0.3 1.9*   ------------------------------------------------------------------------------------------------------------------ estimated creatinine clearance is 75.6 mL/min (by C-G formula based on SCr of 0.52 mg/dL). ------------------------------------------------------------------------------------------------------------------ No results for input(s): TSH, T4TOTAL, T3FREE, THYROIDAB in the last 72 hours.  Invalid input(s): FREET3   Coagulation profile No results for input(s): INR, PROTIME in the last 168 hours. ------------------------------------------------------------------------------------------------------------------- No results for input(s): DDIMER in the last 72 hours. -------------------------------------------------------------------------------------------------------------------  Cardiac Enzymes No results for input(s): CKMB, TROPONINI, MYOGLOBIN in the last 168 hours.  Invalid input(s): CK ------------------------------------------------------------------------------------------------------------------ Invalid input(s): POCBNP   ---------------------------------------------------------------------------------------------------------------  Urinalysis    Component Value Date/Time   COLORURINE YELLOW 01/14/2019 1148   APPEARANCEUR CLEAR 01/14/2019 1148   LABSPEC 1.036 (H) 01/14/2019 1148   PHURINE 6.0 01/14/2019 1148   GLUCOSEU >=500 (A) 01/14/2019 1148   HGBUR MODERATE (A) 01/14/2019 1148   BILIRUBINUR NEGATIVE 01/14/2019 1148   KETONESUR NEGATIVE 01/14/2019 1148   PROTEINUR NEGATIVE 01/14/2019 1148   UROBILINOGEN 0.2 07/08/2018 1444   NITRITE NEGATIVE 01/14/2019 1148   LEUKOCYTESUR NEGATIVE 01/14/2019 1148     ----------------------------------------------------------------------------------------------------------------   Imaging results:   Ct Head Wo Contrast  Result Date: 12/24/2018 CLINICAL DATA:  Piece of metal fell on head. EXAM: CT HEAD WITHOUT CONTRAST TECHNIQUE: Contiguous axial images were obtained from the base of the skull through the vertex without intravenous contrast. COMPARISON:  None. FINDINGS: Brain: No evidence of acute infarction, hemorrhage, hydrocephalus, extra-axial collection or mass lesion/mass effect. Vascular: No hyperdense vessel or unexpected calcification. Skull: Normal. Negative for fracture or focal lesion. Sinuses/Orbits: No acute finding. Other: Tiny 2 mm soft tissue debris involving the high left frontal scalp series 5/9. No significant soft tissue swelling. IMPRESSION: No acute intracranial abnormality.  Tiny left frontal scalp debris. Electronically Signed   By: Tollie Eth M.D.   On: 12/24/2018 17:02   Ct Pelvis W Contrast  Result Date: 01/14/2019 CLINICAL DATA:  Rectal pain. Left gluteal abscess. Evaluate for perirectal abscess. EXAM: CT PELVIS WITH CONTRAST TECHNIQUE: Multidetector CT imaging of the pelvis was performed using the standard protocol following the bolus administration of intravenous contrast. CONTRAST:  OMNIPAQUE IOHEXOL 300 MG/ML  SOLN COMPARISON:  None. FINDINGS: Urinary Tract:  Visualized ureters and bladder are normal. Bowel:  Visualized small bowel, appendix and colon are normal. Vascular/Lymphatic: Normal. Reproductive:  Normal. Other: No free pelvic fluid. 2.5 x 4.2  cm fluid collection within the subcutaneous fat of the left gluteal region containing a few small foci of air compatible with known gluteal abscess. High density material within the medial aspect of this fluid collection which extends in a linear fashion to the skin surface likely representing packing material. This inflammatory/infectious process extends to the superficial  surface over the gluteal muscle. No evidence of extension of this inflammatory/infectious processes to the perirectal or perianal region. The subcutaneous edema does extend to the midline. There is mild inflammatory change adjacent the coccyx. Musculoskeletal: Normal. IMPRESSION: Evidence patient's known left gluteal subcutaneous abscess containing high-density material extending to the skin surface likely packing material. This collection measures 2.5 x 4.2 cm. There is no definite extension to the perirectal/perianal region. Subtle stranding of the fat adjacent the coccyx as the subcutaneous edema may extend to the midline adjacent the coccyx. Electronically Signed   By: Elberta Fortis M.D.   On: 01/14/2019 13:46      Assessment & Plan  Gluteal abscess on the left Status post I&D in the emergency room Continue IV antibiotics and follow cultures Consult surgery if needed in a.m.  Diabetes mellitus type 2 Placed on insulin sliding scale Continue metformin  DVT Prophylaxis   AM Labs Ordered, also please review Full Orders  Family Communication: Admission, patients condition and plan of care including tests being ordered have been discussed with the patient and husband who indicate understanding and agree with the plan and Code Status.  Code Status full  Disposition Plan: Home  Time spent in minutes : 38 minutes  Condition GUARDED   @SIGNATURE @

## 2019-01-15 DIAGNOSIS — E118 Type 2 diabetes mellitus with unspecified complications: Secondary | ICD-10-CM

## 2019-01-15 LAB — GLUCOSE, CAPILLARY
Glucose-Capillary: 212 mg/dL — ABNORMAL HIGH (ref 70–99)
Glucose-Capillary: 229 mg/dL — ABNORMAL HIGH (ref 70–99)
Glucose-Capillary: 171 mg/dL — ABNORMAL HIGH (ref 70–99)
Glucose-Capillary: 187 mg/dL — ABNORMAL HIGH (ref 70–99)

## 2019-01-15 LAB — BASIC METABOLIC PANEL
Anion gap: 9 (ref 5–15)
BUN: 7 mg/dL (ref 6–20)
CHLORIDE: 102 mmol/L (ref 98–111)
CO2: 25 mmol/L (ref 22–32)
Calcium: 8.5 mg/dL — ABNORMAL LOW (ref 8.9–10.3)
Creatinine, Ser: 0.42 mg/dL — ABNORMAL LOW (ref 0.44–1.00)
GFR calc Af Amer: >60 mL/min (ref >60)
GFR calc non Af Amer: >60 mL/min (ref >60)
Glucose, Bld: 238 mg/dL — ABNORMAL HIGH (ref 70–99)
Potassium: 3.5 mmol/L (ref 3.5–5.1)
Sodium: 136 mmol/L (ref 135–145)

## 2019-01-15 LAB — HIV ANTIBODY (ROUTINE TESTING W REFLEX): HIV Screen 4th Generation wRfx: NONREACTIVE

## 2019-01-15 MED ORDER — SODIUM CHLORIDE 0.9 % IV BOLUS
1000.0000 mL | Freq: Once | INTRAVENOUS | Status: AC
  Administered 2019-01-15: 1000 mL via INTRAVENOUS

## 2019-01-15 NOTE — Progress Notes (Signed)
PROGRESS NOTE    Angelica [name]  FGH:[id number] DOB: [birth year] DOA: 01/14/2019 PCP: Department, [facility]   Brief Narrative:  Per admitting provider:  Darshana Broomfield  is a 33 y.o. female, with past medical history significant for diabetes mellitus type 2 presenting left gluteal wound which was drained and packed 2/28.  Patient was not injecting urine into her wound as a part of treatment.  Her wound worsened with increased swelling and redness and increased drainage. In the emergency room patient underwent I&D by ER provider and was started on IV antibiotics   Assessment & Plan:   Active Problems:   Abscess and cellulitis of gluteal region   Gluteal abscess on the left.  Patient status post I&D in the emergency room.,  Continue IV antibiotics, follow-up cultures which remain pending, recheck a CBC in the morning.  Diabetes mellitus type 2.  Continue sliding scale insulin, continue home medications, ADA diet, check blood glucose AC at bedtime.   DVT prophylaxis: Lovenox SQ  Code Status: Full code    Code Status Orders  (From admission, onward)         Start     Ordered   01/14/19 1623  Full code  Continuous     01/14/19 1622        Code Status History    This patient has a current code status but no historical code status.     Family Communication: None present Disposition Plan:   Patient remained inpatient for IV antibiotics for treatment of gluteal abscess. Consults called: None Admission status: Inpatient   Consultants:   None  Procedures:  Ct Head Wo Contrast  Result Date: 12/24/2018 CLINICAL DATA:  Piece of metal fell on head. EXAM: CT HEAD WITHOUT CONTRAST TECHNIQUE: Contiguous axial images were obtained from the base of the skull through the vertex without intravenous contrast. COMPARISON:  None. FINDINGS: Brain: No evidence of acute infarction, hemorrhage, hydrocephalus, extra-axial collection or mass lesion/mass effect. Vascular:  No hyperdense vessel or unexpected calcification. Skull: Normal. Negative for fracture or focal lesion. Sinuses/Orbits: No acute finding. Other: Tiny 2 mm soft tissue debris involving the high left frontal scalp series 5/9. No significant soft tissue swelling. IMPRESSION: No acute intracranial abnormality.  Tiny left frontal scalp debris. Electronically Signed   By: Tollie Eth M.D.   On: 12/24/2018 17:02   Ct Pelvis W Contrast  Result Date: 01/14/2019 CLINICAL DATA:  Rectal pain. Left gluteal abscess. Evaluate for perirectal abscess. EXAM: CT PELVIS WITH CONTRAST TECHNIQUE: Multidetector CT imaging of the pelvis was performed using the standard protocol following the bolus administration of intravenous contrast. CONTRAST:  OMNIPAQUE IOHEXOL 300 MG/ML  SOLN COMPARISON:  None. FINDINGS: Urinary Tract:  Visualized ureters and bladder are normal. Bowel:  Visualized small bowel, appendix and colon are normal. Vascular/Lymphatic: Normal. Reproductive:  Normal. Other: No free pelvic fluid. 2.5 x 4.2 cm fluid collection within the subcutaneous fat of the left gluteal region containing a few small foci of air compatible with known gluteal abscess. High density material within the medial aspect of this fluid collection which extends in a linear fashion to the skin surface likely representing packing material. This inflammatory/infectious process extends to the superficial surface over the gluteal muscle. No evidence of extension of this inflammatory/infectious processes to the perirectal or perianal region. The subcutaneous edema does extend to the midline. There is mild inflammatory change adjacent the coccyx. Musculoskeletal: Normal. IMPRESSION: Evidence patient's known left gluteal subcutaneous abscess containing high-density material  extending to the skin surface likely packing material. This collection measures 2.5 x 4.2 cm. There is no definite extension to the perirectal/perianal region. Subtle stranding of  the fat adjacent the coccyx as the subcutaneous edema may extend to the midline adjacent the coccyx. Electronically Signed   By: Elberta Fortisaniel  Boyle M.D.   On: 01/14/2019 13:46     Antimicrobials:   Flagyl and ceftriaxone day #2   Subjective: Patient is Spanish-speaking using translation line.  Reported no pain doing well.  Objective: Vitals:   01/15/19 0424 01/15/19 0848 01/15/19 1056 01/15/19 1259  BP: (!) 83/54 (!) 78/49 (!) 83/56 (!) 85/43  Pulse: 71 77 73 75  Resp: 16  15 14   Temp: 98.2 F (36.8 C)   98 F (36.7 C)  TempSrc: Oral   Oral  SpO2: 99%   100%  Weight:      Height:        Intake/Output Summary (Last 24 hours) at 01/15/2019 1302 Last data filed at 01/15/2019 0926 Gross per 24 hour  Intake 1109.19 ml  Output -  Net 1109.19 ml   Filed Weights   01/14/19 1258 01/14/19 1636  Weight: 54.7 kg 55.6 kg    Examination:  General exam: Appears calm and comfortable  Respiratory system: Clear to auscultation. Respiratory effort normal. Cardiovascular system: S1 & S2 heard, RRR. No JVD, murmurs, rubs, gallops or clicks. No pedal edema. Gastrointestinal system: Abdomen is nondistended, soft and nontender. No organomegaly or masses felt. Normal bowel sounds heard. Central nervous system: Alert and oriented. No focal neurological deficits. Extremities: Symmetric 5 x 5 power. Skin: Wound with dressing in place, no bleeding no purulence Psychiatry: . Mood & affect appropriate.     Data Reviewed: I have personally reviewed following labs and imaging studies  CBC: Recent Labs  Lab 01/10/19 2216 01/14/19 1147  WBC 7.8 9.0  NEUTROABS 4.8 5.8  HGB 13.3 13.5  HCT 40.4 40.0  MCV 84.7 84.6  PLT 358 457*   Basic Metabolic Panel: Recent Labs  Lab 01/10/19 2216 01/14/19 1147 01/15/19 0336  NA 133* 132* 136  K 3.8 4.4 3.5  CL 100 97* 102  CO2 21* 31 25  GLUCOSE 389* 402* 238*  BUN 9 9 7   CREATININE 0.53 0.52 0.42*  CALCIUM 9.3 9.1 8.5*   GFR: Estimated  Creatinine Clearance: 76.1 mL/min (A) (by C-G formula based on SCr of 0.42 mg/dL (L)). Liver Function Tests: Recent Labs  Lab 01/10/19 2216 01/14/19 1147  AST 19 35  ALT 18 17  ALKPHOS 109 111  BILITOT 0.3 1.9*  PROT 7.4 7.1  ALBUMIN 4.1 3.6   No results for input(s): LIPASE, AMYLASE in the last 168 hours. No results for input(s): AMMONIA in the last 168 hours. Coagulation Profile: No results for input(s): INR, PROTIME in the last 168 hours. Cardiac Enzymes: No results for input(s): CKTOTAL, CKMB, CKMBINDEX, TROPONINI in the last 168 hours. BNP (last 3 results) No results for input(s): PROBNP in the last 8760 hours. HbA1C: No results for input(s): HGBA1C in the last 72 hours. CBG: Recent Labs  Lab 01/14/19 1136 01/14/19 1739 01/14/19 2035 01/15/19 0744 01/15/19 1232  GLUCAP 391* 149* 281* 212* 229*   Lipid Profile: No results for input(s): CHOL, HDL, LDLCALC, TRIG, CHOLHDL, LDLDIRECT in the last 72 hours. Thyroid Function Tests: No results for input(s): TSH, T4TOTAL, FREET4, T3FREE, THYROIDAB in the last 72 hours. Anemia Panel: No results for input(s): VITAMINB12, FOLATE, FERRITIN, TIBC, IRON, RETICCTPCT in the last 72  hours. Sepsis Labs: Recent Labs  Lab 01/14/19 1147 01/14/19 1706  LATICACIDVEN 2.2* 1.1    Recent Results (from the past 240 hour(s))  Culture, blood (routine x 2)     Status: None (Preliminary result)   Collection Time: 01/14/19 11:48 AM  Result Value Ref Range Status   Specimen Description BLOOD RIGHT ANTECUBITAL  Final   Special Requests   Final    BOTTLES DRAWN AEROBIC AND ANAEROBIC Blood Culture adequate volume   Culture   Final    NO GROWTH < 24 HOURS Performed at [facility], [address], [place], [place] [address]    Report Status PENDING  Incomplete  Culture, blood (routine x 2)     Status: None (Preliminary result)   Collection Time: 01/14/19 12:00 PM  Result Value Ref Range Status   Specimen Description BLOOD LEFT  ANTECUBITAL  Final   Special Requests   Final    BOTTLES DRAWN AEROBIC ONLY Blood Culture adequate volume   Culture   Final    NO GROWTH < 24 HOURS Performed at [facility], [address] [place], [place] [address]    Report Status PENDING  Incomplete         Radiology Studies: Ct Pelvis W Contrast  Result Date: 01/14/2019 CLINICAL DATA:  Rectal pain. Left gluteal abscess. Evaluate for perirectal abscess. EXAM: CT PELVIS WITH CONTRAST TECHNIQUE: Multidetector CT imaging of the pelvis was performed using the standard protocol following the bolus administration of intravenous contrast. CONTRAST:  OMNIPAQUE IOHEXOL 300 MG/ML  SOLN COMPARISON:  None. FINDINGS: Urinary Tract:  Visualized ureters and bladder are normal. Bowel:  Visualized small bowel, appendix and colon are normal. Vascular/Lymphatic: Normal. Reproductive:  Normal. Other: No free pelvic fluid. 2.5 x 4.2 cm fluid collection within the subcutaneous fat of the left gluteal region containing a few small foci of air compatible with known gluteal abscess. High density material within the medial aspect of this fluid collection which extends in a linear fashion to the skin surface likely representing packing material. This inflammatory/infectious process extends to the superficial surface over the gluteal muscle. No evidence of extension of this inflammatory/infectious processes to the perirectal or perianal region. The subcutaneous edema does extend to the midline. There is mild inflammatory change adjacent the coccyx. Musculoskeletal: Normal. IMPRESSION: Evidence patient's known left gluteal subcutaneous abscess containing high-density material extending to the skin surface likely packing material. This collection measures 2.5 x 4.2 cm. There is no definite extension to the perirectal/perianal region. Subtle stranding of the fat adjacent the coccyx as the subcutaneous edema may extend to the midline adjacent the coccyx.  Electronically Signed   By: Elberta Fortis M.D.   On: 01/14/2019 13:46        Scheduled Meds: . enoxaparin (LOVENOX) injection  40 mg Subcutaneous Q24H  . Influenza vac split quadrivalent PF  0.5 mL Intramuscular Tomorrow-1000  . insulin aspart  0-5 Units Subcutaneous QHS  . insulin aspart  0-9 Units Subcutaneous TID WC  . metFORMIN  500 mg Oral BID WC  . pneumococcal 23 valent vaccine  0.5 mL Intramuscular Tomorrow-1000   Continuous Infusions: . sodium chloride 100 mL/hr at 01/15/19 0559  . cefTRIAXone (ROCEPHIN)  IV 2 g (01/15/19 1220)  . metronidazole 500 mg (01/15/19 0559)  . vancomycin 1,250 mg (01/15/19 1056)     LOS: 1 day    Time spent: 35 min    Burke Keels, MD [facility]  If 7PM-7AM, please contact  night-coverage  01/15/2019, 1:02 PM

## 2019-01-15 NOTE — Care Management Note (Addendum)
Case Management Note  Patient Details  Name: Angelica [name] MRN: [id number] Date of Birth: 10-27-1986  Subjective/Objective:                    Action/Plan:  From home with husband.   PCP [facility] Department, called same left voicemail awaiting call back.   Patient and family will have to be taught wound care prior to discharge.   Will continue to follow for PCP appointment and medication assistance.   Spoke to Jerrell Belfast NP at [facility] Department [phone]. Main PCP number [phone]. Patient was seen twice in 2019 last time was October 2019 and never followed up. Patient can be seen there , however, she would need to establish care , hospital follow up appointment is $65.00.  Expected Discharge Date:                  Expected Discharge Plan:  Home/Self Care  In-House Referral:     Discharge planning Services  CM Consult, Medication Assistance, MATCH Program, Indigent Health Clinic  Post Acute Care Choice:    Choice offered to:     DME Arranged:    DME Agency:     HH Arranged:    HH Agency:     Status of Service:  In process, will continue to follow  If discussed at Long Length of Stay Meetings, dates discussed:    Additional Comments:  Kingsley Plan, RN 01/15/2019, 2:04 PM

## 2019-01-15 NOTE — Progress Notes (Addendum)
Inpatient Diabetes Program Recommendations  AACE/ADA: New Consensus Statement on Inpatient Glycemic Control (2015)  Target Ranges:  Prepandial:   less than 140 mg/dL      Peak postprandial:   less than 180 mg/dL (1-2 hours)      Critically ill patients:  140 - 180 mg/dL   Results for JOICE, DONZE (MRN [id number]) as of 01/15/2019 11:35  Ref. Range 01/14/2019 11:36 01/14/2019 17:39 01/14/2019 20:35 01/15/2019 07:44  Glucose-Capillary Latest Ref Range: 70 - 99 mg/dL 164 (H) Novolog 8 units given 149 (H) Novolog 1 units given 281 (H) Novolog 3 units given 212 (H) Novolog 3 units given   Review of Glycemic Control  Diabetes history: DM 2 Outpatient Diabetes medications: Metformin 500 mg BID Current orders for Inpatient glycemic control: Novolog 0-9 units tid, Novolog 0-5 units qhs, Metformin 500 mg BID  Inpatient Diabetes Program Recommendations:    Glucose trends elevated on Novolog 0-9 units tid. Please consider increasing correction scale to Novolog 0-15 units tid.  Thanks,  Christena Deem RN, MSN, BC-ADM Inpatient Diabetes Coordinator Team Pager [phone] (8a-5p)

## 2019-01-16 LAB — BASIC METABOLIC PANEL
Anion gap: 8 (ref 5–15)
BUN: 5 mg/dL — AB (ref 6–20)
CO2: 25 mmol/L (ref 22–32)
Calcium: 8.8 mg/dL — ABNORMAL LOW (ref 8.9–10.3)
Chloride: 101 mmol/L (ref 98–111)
Creatinine, Ser: 0.4 mg/dL — ABNORMAL LOW (ref 0.44–1.00)
GFR calc Af Amer: >60 mL/min (ref >60)
GFR calc non Af Amer: >60 mL/min (ref >60)
Glucose, Bld: 213 mg/dL — ABNORMAL HIGH (ref 70–99)
POTASSIUM: 3.9 mmol/L (ref 3.5–5.1)
Sodium: 134 mmol/L — ABNORMAL LOW (ref 135–145)

## 2019-01-16 LAB — CBC WITH DIFFERENTIAL/PLATELET
Abs Immature Granulocytes: 0.1 10*3/uL — ABNORMAL HIGH (ref 0.00–0.07)
Basophils Absolute: 0 10*3/uL (ref 0.0–0.1)
Basophils Relative: 0 %
Eosinophils Absolute: 0.3 10*3/uL (ref 0.0–0.5)
Eosinophils Relative: 4 %
HCT: 35.8 % — ABNORMAL LOW (ref 36.0–46.0)
Hemoglobin: 11.7 g/dL — ABNORMAL LOW (ref 12.0–15.0)
LYMPHS PCT: 32 %
Lymphs Abs: 2.7 10*3/uL (ref 0.7–4.0)
MCH: 27.7 pg (ref 26.0–34.0)
MCHC: 32.7 g/dL (ref 30.0–36.0)
MCV: 84.8 fL (ref 80.0–100.0)
Monocytes Absolute: 0.4 10*3/uL (ref 0.1–1.0)
Monocytes Relative: 5 %
Myelocytes: 1 %
Neutro Abs: 4.8 10*3/uL (ref 1.7–7.7)
Neutrophils Relative %: 58 %
PLATELETS: 462 10*3/uL — AB (ref 150–400)
RBC: 4.22 MIL/uL (ref 3.87–5.11)
RDW: 11.9 % (ref 11.5–15.5)
WBC: 8.3 10*3/uL (ref 4.0–10.5)
nRBC: 0 % (ref 0.0–0.2)
nRBC: 0 /100 WBC

## 2019-01-16 LAB — GLUCOSE, CAPILLARY
Glucose-Capillary: 185 mg/dL — ABNORMAL HIGH (ref 70–99)
Glucose-Capillary: 160 mg/dL — ABNORMAL HIGH (ref 70–99)
Glucose-Capillary: 239 mg/dL — ABNORMAL HIGH (ref 70–99)

## 2019-01-16 MED ORDER — METFORMIN HCL 500 MG PO TABS: 500.0000 mg | ORAL_TABLET | Freq: Two times a day (BID) | ORAL | 0 refills | Status: DC

## 2019-01-16 MED ORDER — CLINDAMYCIN HCL 300 MG PO CAPS: 300.0000 mg | ORAL_CAPSULE | Freq: Four times a day (QID) | ORAL | 0 refills | Status: AC

## 2019-01-16 MED FILL — CLINDAMYCIN HCL 300 MG CAP: 300 | 10 days supply | Qty: 40 | Fill #0

## 2019-01-16 MED FILL — metFORMIN HCL 500 MG TABS: 500 | 30 days supply | Qty: 60 | Fill #0

## 2019-01-16 NOTE — Consult Note (Signed)
WOC Nurse wound consult note Reason for Consult:Abscess to left gluteal.  S/P I & D in the ED.  Will discharge today.  Is Spanish speaking and interpreter services are utilized.  Spouse will be providing wound care however he will be working until 6 today and will be here after.  I have ordered supplies and bedside RN will need to teach the wound care to the spouse for daily Iodoform packing strip, cover with dry dressing and tape.  Bedside RN is in the room and is aware.  Wound type:infectious Pressure Injury POA: NA Measurement: LEft gluteal I & D site:  1.5 cm slit, depth is 2 cm.  Some bleeding today. No purulence Wound bed:not able to visualize Drainage (amount, consistency, odor) bleeding Periwound:Induration and erythema, improved Dressing procedure/placement/frequency:Please teach spouse/caregiver:  Cleanse wound with NS and pat gently dry.  Fill wound depth with 1/4" iodoform packing strip .  Cover with dry dressing and tape.  Change daily.  With interpreter, I have educated patient of wound care and that we will work with spouse when he is here. This is daily.  I educated her to avoid home remedies, such as urine in the wound to cure her diabetes.  Discussed medication and dietary management of this chronic disease. She verbalizes understanding via interpreter services. She denies pain in the wound.  Will not follow at this time.  Please re-consult if needed.  Maple Hudson MSN, RN, FNP-BC CWON Wound, Ostomy, Continence Nurse Pager [phone]

## 2019-01-16 NOTE — Discharge Summary (Signed)
Physician Discharge Summary  Angelica [name] VWU:[id number] DOB: 07/02/1986 DOA: 01/14/2019  PCP: Department, [facility]  Admit date: 01/14/2019 Discharge date: 01/16/2019  Admitted From: Inpatient Disposition: home  Recommendations for Outpatient Follow-up:  1. Follow up with PCP in 1-2 weeks 2. Please follow up on the following pending results:none  Home Health:No Equipment/Devices:none  Discharge Condition:Stable CODE STATUS:Full code Diet recommendation: Diabetic diet    Brief/Interim Summary: Per admitting provider:  AlbaGomez Alvaradois a33 y.o.female,with past medical history significant for diabetes mellitus type 2 presenting left gluteal wound which was drained and packed 2/28. Patient was not injecting urine into her wound as a part of treatment. Her wound worsened with increased swelling and redness and increased drainage. In the emergency room patient underwent I&D by ER provider and was started on IV antibiotics   Hospital course: Gluteal abscess on the left.  Patient presented to the ER with a chief complaint as above.  In the ER she had an I&D and was seen by the hospital service.  While in the hospital we Continued IV antibiotics, obtain blood cultures which were negative, white count was normal,.  Patient was seen by wound care with recommendations for daily Iodoform packing strip, cover with dry dressing and tape.  Patient will complete a 10-day course of clindamycin continue with packing at home and follow-up with PCP as needed.  Diabetes mellitus type 2.    While in the hospital patient was placed on sliding scale insulin, continued home medications, ADA diet, checked blood glucose AC at bedtime..  Patient will continue home medications on discharge will need close management of hemoglobin A1c and lipids through her primary care physician.   Discharge Diagnoses:  Active Problems:   Abscess and cellulitis of gluteal region    Discharge  Instructions  Discharge Instructions    Call MD for:   Complete by:  As directed    For any acute change in medical condition   Diet Carb Modified   Complete by:  As directed    Discharge wound care:   Complete by:  As directed    As discussed/taught   Increase activity slowly   Complete by:  As directed      Allergies as of 01/16/2019   No Known Allergies     Medication List    STOP taking these medications   doxycycline 100 MG capsule Commonly known as:  VIBRAMYCIN     TAKE these medications   clindamycin 300 MG capsule Commonly known as:  CLEOCIN Take 1 capsule (300 mg total) by mouth 4 (four) times daily for 10 days.   metFORMIN 500 MG tablet Commonly known as:  GLUCOPHAGE Take 1 tablet (500 mg total) by mouth 2 (two) times daily with a meal for 30 days.   naproxen 375 MG tablet Commonly known as:  NAPROSYN Take 1 tablet (375 mg total) by mouth 2 (two) times daily.            Discharge Care Instructions  (From admission, onward)         Start     Ordered   01/16/19 0000  Discharge wound care:    Comments:  As discussed/taught   01/16/19 1105         Follow-up Information    [facility] Follow up.   Why:  follow up appointment January 17, 2019 at 0930, you can apply for financial assistance , take ID that is not expired. Contact information: [address] [place] [address] [phone]         No Known Allergies  Consultations:  wound care   Procedures/Studies: Ct Head Wo Contrast  Result Date: 12/24/2018 CLINICAL DATA:  Piece of metal fell on head. EXAM: CT HEAD WITHOUT CONTRAST TECHNIQUE: Contiguous axial images were obtained from the base of the skull through the vertex without intravenous contrast. COMPARISON:  None. FINDINGS: Brain: No evidence of acute infarction, hemorrhage, hydrocephalus, extra-axial collection or mass lesion/mass effect. Vascular: No hyperdense vessel or  unexpected calcification. Skull: Normal. Negative for fracture or focal lesion. Sinuses/Orbits: No acute finding. Other: Tiny 2 mm soft tissue debris involving the high left frontal scalp series 5/9. No significant soft tissue swelling. IMPRESSION: No acute intracranial abnormality.  Tiny left frontal scalp debris. Electronically Signed   By: Tollie Eth M.D.   On: 12/24/2018 17:02   Ct Pelvis W Contrast  Result Date: 01/14/2019 CLINICAL DATA:  Rectal pain. Left gluteal abscess. Evaluate for perirectal abscess. EXAM: CT PELVIS WITH CONTRAST TECHNIQUE: Multidetector CT imaging of the pelvis was performed using the standard protocol following the bolus administration of intravenous contrast. CONTRAST:  OMNIPAQUE IOHEXOL 300 MG/ML  SOLN COMPARISON:  None. FINDINGS: Urinary Tract:  Visualized ureters and bladder are normal. Bowel:  Visualized small bowel, appendix and colon are normal. Vascular/Lymphatic: Normal. Reproductive:  Normal. Other: No free pelvic fluid. 2.5 x 4.2 cm fluid collection within the subcutaneous fat of the left gluteal region containing a few small foci of air compatible with known gluteal abscess. High density material within the medial aspect of this fluid collection which extends in a linear fashion to the skin surface likely representing packing material. This inflammatory/infectious process extends to the superficial surface over the gluteal muscle. No evidence of extension of this inflammatory/infectious processes to the perirectal or perianal region. The subcutaneous edema does extend to the midline. There is mild inflammatory change adjacent the coccyx. Musculoskeletal: Normal. IMPRESSION: Evidence patient's known left gluteal subcutaneous abscess containing high-density material extending to the skin surface likely packing material. This collection measures 2.5 x 4.2 cm. There is no definite extension to the perirectal/perianal region. Subtle stranding of the fat adjacent the  coccyx as the subcutaneous edema may extend to the midline adjacent the coccyx. Electronically Signed   By: Elberta Fortis M.D.   On: 01/14/2019 13:46       Subjective: Patient reports pain is well controlled and has resolved.  Reports he feels at baseline.  Stable for discharge home today with antibiotics and wound care as above.  Discussed the plan using interpreter line with patient who is in agreement with plan.  Her son is coming up for additional training and wound management.  Discharge Exam: Vitals:   01/16/19 0526 01/16/19 1004  BP: 100/66 99/67  Pulse: 68 67  Resp: 16 20  Temp: 98.3 F (36.8 C) 98.5 F (36.9 C)  SpO2: 100% 100%   Vitals:   01/15/19 2104 01/16/19 0107 01/16/19 0526 01/16/19 1004  BP: (!) 87/55 (!) 86/58 100/66 99/67  Pulse: 66 66 68 67  Resp: Temp: 98.3 F (36.8 C) 98.3 F (36.8 C) 98.3 F (36.8 C) 98.5 F (36.9 C)  TempSrc: Oral Oral Oral Oral  SpO2: 99% 99% 100% 100%  Weight:      Height:        General: Pt is alert, awake, not in acute distress Cardiovascular: RRR, S1/S2 +, no rubs, no gallops Respiratory: CTA bilaterally,  no wheezing, no rhonchi Abdominal: Soft, NT, ND, bowel sounds + Extremities: no edema, no cyanosis    The results of significant diagnostics from this hospitalization (including imaging, microbiology, ancillary and laboratory) are listed below for reference.     Microbiology: Recent Results (from the past 240 hour(s))  Culture, blood (routine x 2)     Status: None (Preliminary result)   Collection Time: 01/14/19 11:48 AM  Result Value Ref Range Status   Specimen Description BLOOD RIGHT ANTECUBITAL  Final   Special Requests   Final    BOTTLES DRAWN AEROBIC AND ANAEROBIC Blood Culture adequate volume   Culture   Final    NO GROWTH < 24 HOURS Performed at [facility], [address] [place], [place] [address]    Report Status PENDING  Incomplete  Culture, blood (routine x 2)     Status: None  (Preliminary result)   Collection Time: 01/14/19 12:00 PM  Result Value Ref Range Status   Specimen Description BLOOD LEFT ANTECUBITAL  Final   Special Requests   Final    BOTTLES DRAWN AEROBIC ONLY Blood Culture adequate volume   Culture   Final    NO GROWTH < 24 HOURS Performed at [facility], [address] [place], [place] [address]    Report Status PENDING  Incomplete     Labs: BNP (last 3 results) No results for input(s): BNP in the last 8760 hours. Basic Metabolic Panel: Recent Labs  Lab 01/10/19 2216 01/14/19 1147 01/15/19 0336 01/16/19 0132  NA 133* 132* 136 134*  K 3.8 4.4 3.5 3.9  CL 100 97* 102 101  CO2 21* 31 25 25   GLUCOSE 389* 402* 238* 213*  BUN 9 9 7  5*  CREATININE 0.53 0.52 0.42* 0.40*  CALCIUM 9.3 9.1 8.5* 8.8*   Liver Function Tests: Recent Labs  Lab 01/10/19 2216 01/14/19 1147  AST 19 35  ALT 18 17  ALKPHOS 109 111  BILITOT 0.3 1.9*  PROT 7.4 7.1  ALBUMIN 4.1 3.6   No results for input(s): LIPASE, AMYLASE in the last 168 hours. No results for input(s): AMMONIA in the last 168 hours. CBC: Recent Labs  Lab 01/10/19 2216 01/14/19 1147 01/16/19 0132  WBC 7.8 9.0 8.3  NEUTROABS 4.8 5.8 4.8  HGB 13.3 13.5 11.7*  HCT 40.4 40.0 35.8*  MCV 84.7 84.6 84.8  PLT 358 457* 462*   Cardiac Enzymes: No results for input(s): CKTOTAL, CKMB, CKMBINDEX, TROPONINI in the last 168 hours. BNP: Invalid input(s): POCBNP CBG: Recent Labs  Lab 01/15/19 0744 01/15/19 1232 01/15/19 1717 01/15/19 2105 01/16/19 0754  GLUCAP 212* 229* 171* 187* 185*   D-Dimer No results for input(s): DDIMER in the last 72 hours. Hgb A1c No results for input(s): HGBA1C in the last 72 hours. Lipid Profile No results for input(s): CHOL, HDL, LDLCALC, TRIG, CHOLHDL, LDLDIRECT in the last 72 hours. Thyroid function studies No results for input(s): TSH, T4TOTAL, T3FREE, THYROIDAB in the last 72 hours.  Invalid input(s): FREET3 Anemia work up No results for  input(s): VITAMINB12, FOLATE, FERRITIN, TIBC, IRON, RETICCTPCT in the last 72 hours. Urinalysis    Component Value Date/Time   COLORURINE YELLOW 01/14/2019 1148   APPEARANCEUR CLEAR 01/14/2019 1148   LABSPEC 1.036 (H) 01/14/2019 1148   PHURINE 6.0 01/14/2019 1148   GLUCOSEU >=500 (A) 01/14/2019 1148   HGBUR MODERATE (A) 01/14/2019 1148   BILIRUBINUR NEGATIVE 01/14/2019 1148   KETONESUR NEGATIVE 01/14/2019 1148   PROTEINUR NEGATIVE 01/14/2019  1148   UROBILINOGEN 0.2 07/08/2018 1444   NITRITE NEGATIVE 01/14/2019 1148   LEUKOCYTESUR NEGATIVE 01/14/2019 1148   Sepsis Labs Invalid input(s): PROCALCITONIN,  WBC,  LACTICIDVEN Microbiology Recent Results (from the past 240 hour(s))  Culture, blood (routine x 2)     Status: None (Preliminary result)   Collection Time: 01/14/19 11:48 AM  Result Value Ref Range Status   Specimen Description BLOOD RIGHT ANTECUBITAL  Final   Special Requests   Final    BOTTLES DRAWN AEROBIC AND ANAEROBIC Blood Culture adequate volume   Culture   Final    NO GROWTH < 24 HOURS Performed at [facility], [address] [place], [place] [address]    Report Status PENDING  Incomplete  Culture, blood (routine x 2)     Status: None (Preliminary result)   Collection Time: 01/14/19 12:00 PM  Result Value Ref Range Status   Specimen Description BLOOD LEFT ANTECUBITAL  Final   Special Requests   Final    BOTTLES DRAWN AEROBIC ONLY Blood Culture adequate volume   Culture   Final    NO GROWTH < 24 HOURS Performed at [facility], [address], [place], [place] [address]    Report Status PENDING  Incomplete     Time coordinating discharge: Over 30 minutes  SIGNED:   Burke Keels, MD  [facility] 01/16/2019, 11:05 AM Pager   If 7PM-7AM, please contact night-coverage [email or web address] Password [email or web address]

## 2019-01-16 NOTE — Progress Notes (Signed)
Patient discharged to home. Discharge instructions reviewed with family and Spanish interpreter, Marly. Patient's husband taught how to change the left buttock wound. Sent plenty of supplies to change dressing. Family and patient verbalize understanding of all discharge instructions including incision care, discharge medications, and follow up MD visits. All questions answered.

## 2019-01-16 NOTE — Care Management Note (Addendum)
Case Management Note  Patient Details  Name: Rockie Winkler MRN: [id number] Date of Birth: Mar 27, 1986  Subjective/Objective:                    Action/Plan:    Scheduled appointment for patient at [facility] for tomorrow January 17, 2019. Patient needs to take an ID card with her. She does not need to take money if she does not have, she can apply for financial assistance.   Patient has two  prescriptions which was sent to [facility], cost is $10. Paid $10 from fund from Case Management Department so she can use MATCH in future if needed.   [facility] will bring both prescriptions to patient free of charge to patient.  Expected Discharge Date:  01/16/19               Expected Discharge Plan:  Home/Self Care  In-House Referral:     Discharge planning Services  CM Consult, Medication Assistance, MATCH Program, [facility]igent [facility]  Post Acute Care Choice:  NA Choice offered to:  NA  DME Arranged:  N/A DME Agency:  NA  HH Arranged:  NA HH Agency:     Status of Service:  In process, will continue to follow  If discussed at Long Length of Stay Meetings, dates discussed:    Additional Comments:  Kingsley Plan, RN 01/16/2019, 11:37 AM

## 2019-01-17 ENCOUNTER — Encounter: Payer: Self-pay | Admitting: Internal Medicine

## 2019-01-17 ENCOUNTER — Ambulatory Visit: Payer: Self-pay | Attending: Family Medicine | Admitting: Pharmacist

## 2019-01-17 ENCOUNTER — Encounter: Payer: Self-pay | Admitting: Pharmacist

## 2019-01-17 ENCOUNTER — Ambulatory Visit: Payer: Self-pay | Attending: Internal Medicine | Admitting: Internal Medicine

## 2019-01-17 VITALS — BP 99/63 | HR 66 | Temp 97.4°F | Resp 16 | Ht <= 58 in | Wt 120.8 lb

## 2019-01-17 DIAGNOSIS — IMO0001 Reserved for inherently not codable concepts without codable children: Secondary | ICD-10-CM | POA: Insufficient documentation

## 2019-01-17 DIAGNOSIS — Z79899 Other long term (current) drug therapy: Secondary | ICD-10-CM

## 2019-01-17 DIAGNOSIS — E1165 Type 2 diabetes mellitus with hyperglycemia: Secondary | ICD-10-CM

## 2019-01-17 DIAGNOSIS — L0231 Cutaneous abscess of buttock: Secondary | ICD-10-CM

## 2019-01-17 DIAGNOSIS — Z4801 Encounter for change or removal of surgical wound dressing: Secondary | ICD-10-CM

## 2019-01-17 DIAGNOSIS — E119 Type 2 diabetes mellitus without complications: Secondary | ICD-10-CM | POA: Insufficient documentation

## 2019-01-17 LAB — POCT GLYCOSYLATED HEMOGLOBIN (HGB A1C)

## 2019-01-17 LAB — GLUCOSE, POCT (MANUAL RESULT ENTRY): POC Glucose: 274 mg/dl — AB (ref 70–99)

## 2019-01-17 MED ORDER — METFORMIN HCL 500 MG PO TABS: 500.0000 mg | ORAL_TABLET | Freq: Two times a day (BID) | ORAL | 6 refills | Status: DC

## 2019-01-17 MED ORDER — GLUCOSE BLOOD VI STRP: ORAL_STRIP | 12 refills | Status: DC

## 2019-01-17 MED ORDER — TRUE METRIX METER W/DEVICE KIT: PACK | 0 refills | Status: DC

## 2019-01-17 MED ORDER — TRUEPLUS LANCETS 28G MISC: 4 refills | Status: DC

## 2019-01-17 MED ORDER — INSULIN GLARGINE 100 UNIT/ML SOLOSTAR PEN: 10.0000 [IU] | PEN_INJECTOR | Freq: Every day | SUBCUTANEOUS | 99 refills | Status: DC

## 2019-01-17 NOTE — Progress Notes (Signed)
[id number]

## 2019-01-17 NOTE — Progress Notes (Signed)
Patient was educated on the use of the True Metrix blood glucose meter and Lantus Solostar pen. Reviewed necessary supplies and operation of the meter and pen. Also reviewed goal blood glucose levels. Patient was able to demonstrate use. All questions and concerns were addressed.

## 2019-01-17 NOTE — Progress Notes (Signed)
Patient ID: Angelica [name], female    DOB: 11/20/1985  MRN: [id number]  CC: Hospitalization Follow-up   Subjective: Angelica [name] File is a 33 y.o. female who presents for new patient visit and hospital follow-up. concerns today include:   Patient hospitalized 3/3-03/2019 with left gluteal abscess.  This was I&D.  Patient treated with antibiotics.  Blood cultures negative.  CT of the pelvis revealed no extension to the perirectal or perianal area.  Patient discharged home with instructions of dressing change daily.  She is on clindamycin for total of 10 days.  Of note patient has history of diabetes and is currently on metformin.  Dressing last change yesterday before leaving hosp at 6:30 p.m.  Plan is for husband to do dressing change once a day and patient states he was shown how to do so upon discharge from the hospital yesterday.  They have supplies at home.  Did get the Clinda and is taking.  No fever or inc pain in buttock.   DM:  Dx with DM in 06/2018.   On Metformin Denies polydipsia, polyuria, blurred vision, numbness in hands/feet.  No wgh changes.   No device to check BS.   Patient Active Problem List   Diagnosis Date Noted  . Diabetes mellitus type 2, uncontrolled, without complications ([place]) 35/32/9924  . Abscess and cellulitis of gluteal region 01/14/2019     Current Outpatient Medications on File Prior to Visit  Medication Sig Dispense Refill  . clindamycin (CLEOCIN) 300 MG capsule Take 1 capsule (300 mg total) by mouth 4 (four) times daily for 10 days. 40 capsule 0  . naproxen (NAPROSYN) 375 MG tablet Take 1 tablet (375 mg total) by mouth 2 (two) times daily. 20 tablet 0   No current facility-administered medications on file prior to visit.     No Known Allergies  Social History   Socioeconomic History  . Marital status: Married    Spouse name: Not on file  . Number of children: Not on file  . Years of education: Not on file  . Highest education level:  Not on file  Occupational History  . Not on file  Social Needs  . Financial resource strain: Not on file  . Food insecurity:    Worry: Not on file    Inability: Not on file  . Transportation needs:    Medical: Not on file    Non-medical: Not on file  Tobacco Use  . Smoking status: Never Smoker  . Smokeless tobacco: Never Used  Substance and Sexual Activity  . Alcohol use: Never    Frequency: Never  . Drug use: Never  . Sexual activity: Not on file  Lifestyle  . Physical activity:    Days per week: Not on file    Minutes per session: Not on file  . Stress: Not on file  Relationships  . Social connections:    Talks on phone: Not on file    Gets together: Not on file    Attends religious service: Not on file    Active member of club or organization: Not on file    Attends meetings of clubs or organizations: Not on file    Relationship status: Not on file  . Intimate partner violence:    Fear of current or ex partner: Not on file    Emotionally abused: Not on file    Physically abused: Not on file    Forced sexual activity: Not on file  Other Topics Concern  .  Not on file  Social History Narrative  . Not on file    No family history on file.  Past Surgical History:  Procedure Laterality Date  . CESAREAN SECTION    . CHOLECYSTECTOMY      ROS: Review of Systems Negative except as stated above  PHYSICAL EXAM: BP 99/63   Pulse 66   Temp (!) 97.4 F (36.3 C) (Oral)   Resp 16   Ht 4' 10"  (1.473 m)   Wt 120 lb 12.8 oz (54.8 kg)   SpO2 100%   BMI 25.25 kg/m   Wt Readings from Last 3 Encounters:  01/17/19 120 lb 12.8 oz (54.8 kg)  01/14/19 122 lb 9.6 oz (55.6 kg)    Physical Exam  General appearance - alert, well appearing, and in no distress Mental status - normal mood, behavior, speech, dress, motor activity, and thought processes Neck - supple, no significant adenopathy Chest - clear to auscultation, no wheezes, rales or rhonchi, symmetric air  entry Heart - normal rate, regular rhythm, normal S1, S2, no murmurs, rubs, clicks or gallops Extremities - peripheral pulses normal, no pedal edema, no clubbing or cyanosis Skin -left buttock: Dressing was removed.  She had bloody pus drainage on the gauze.  Packing was removed and was noted to be soaked with bloody pus drainage.  Incision is about 2 cm in size.  No surrounding erythema or fluctuance.  No pus expressed from the incision site. Diabetic Foot Exam - Simple   Simple Foot Form Visual Inspection No deformities, no ulcerations, no other skin breakdown bilaterally:  Yes Sensation Testing Intact to touch and monofilament testing bilaterally:  Yes Pulse Check Posterior Tibialis and Dorsalis pulse intact bilaterally:  Yes Comments      CMP Latest Ref Rng & Units 01/16/2019 01/15/2019 01/14/2019  Glucose 70 - 99 mg/dL 213(H) 238(H) 402(H)  BUN 6 - 20 mg/dL 5(L) 7 9  Creatinine 0.44 - 1.00 mg/dL 0.40(L) 0.42(L) 0.52  Sodium 135 - 145 mmol/L 134(L) 136 132(L)  Potassium 3.5 - 5.1 mmol/L 3.9 3.5 4.4  Chloride 98 - 111 mmol/L 101 102 97(L)  CO2 22 - 32 mmol/L 25 25 31   Calcium 8.9 - 10.3 mg/dL 8.8(L) 8.5(L) 9.1  Total Protein 6.5 - 8.1 g/dL - - 7.1  Total Bilirubin 0.3 - 1.2 mg/dL - - 1.9(H)  Alkaline Phos 38 - 126 U/L - - 111  AST 15 - 41 U/L - - 35  ALT 0 - 44 U/L - - 17   Lipid Panel  No results found for: CHOL, TRIG, HDL, CHOLHDL, VLDL, LDLCALC, LDLDIRECT  CBC    Component Value Date/Time   WBC 8.3 01/16/2019 0132   RBC 4.22 01/16/2019 0132   HGB 11.7 (L) 01/16/2019 0132   HCT 35.8 (L) 01/16/2019 0132   PLT 462 (H) 01/16/2019 0132   MCV 84.8 01/16/2019 0132   MCH 27.7 01/16/2019 0132   MCHC 32.7 01/16/2019 0132   RDW 11.9 01/16/2019 0132   LYMPHSABS 2.7 01/16/2019 0132   MONOABS 0.4 01/16/2019 0132   EOSABS 0.3 01/16/2019 0132   BASOSABS 0.0 01/16/2019 0132    ASSESSMENT AND PLAN: 1. Abscess, gluteal, left Packing was removed.  New packing placed and dressing  change done.  Patient told to remove the packing and change dressing in 24 hours.  We will have her follow-up with the nurse on Monday to have the dressing change done.  2. Diabetes mellitus type 2, uncontrolled, without complications ([place]) Discussed diagnosis of diabetes  and management through healthy eating habits and regular exercise.  Given that her A1c at this time is greater than 15, I recommend adding Lantus insulin 10 units at bedtime.  She will continue metformin.  Clinical pharmacist met with patient today to instruct on insulin injection and how to check blood sugar with glucometer.  Advised patient that goal for blood sugars before meals is 90-130.  I went over signs and symptoms of hypoglycemia and how to treat it - Microalbumin / creatinine urine ratio - POCT glucose (manual entry) - POCT glycosylated hemoglobin (Hb A1C) - Insulin Glargine (LANTUS SOLOSTAR) 100 UNIT/ML Solostar Pen; Inject 10 Units into the skin daily.  Dispense: 5 pen; Refill: PRN - metFORMIN (GLUCOPHAGE) 500 MG tablet; Take 1 tablet (500 mg total) by mouth 2 (two) times daily with a meal.  Dispense: 60 tablet; Refill: 6 - Blood Glucose Monitoring Suppl (TRUE METRIX METER) w/Device KIT; Use as directed  Dispense: 1 kit; Refill: 0 - glucose blood (TRUE METRIX BLOOD GLUCOSE TEST) test strip; Use as instructed  Dispense: 100 each; Refill: 12 - TRUEplus Lancets 28G MISC; Use as directed  Dispense: 100 each; Refill: 4   Patient was given the opportunity to ask questions.  Patient verbalized understanding of the plan and was able to repeat key elements of the plan.  [employer] interpreter used during this encounter. #[id number] Spent 20 minutes spent direct face-to-face with this patient discussing diagnosis and management.    Orders Placed This Encounter  Procedures  . Microalbumin / creatinine urine ratio  . POCT glucose (manual entry)  . POCT glycosylated hemoglobin (Hb A1C)     Requested Prescriptions   Signed  Prescriptions Disp Refills  . Insulin Glargine (LANTUS SOLOSTAR) 100 UNIT/ML Solostar Pen 5 pen PRN    Sig: Inject 10 Units into the skin daily.  . metFORMIN (GLUCOPHAGE) 500 MG tablet 60 tablet 6    Sig: Take 1 tablet (500 mg total) by mouth 2 (two) times daily with a meal.  . Blood Glucose Monitoring Suppl (TRUE METRIX METER) w/Device KIT 1 kit 0    Sig: Use as directed  . glucose blood (TRUE METRIX BLOOD GLUCOSE TEST) test strip 100 each 12    Sig: Use as instructed  . TRUEplus Lancets 28G MISC 100 each 4    Sig: Use as directed    Return in about 2 weeks (around 01/31/2019).  Karle Plumber, MD, FACP

## 2019-01-17 NOTE — Patient Instructions (Addendum)
Give pt follow up with nurse on Monday of next week for dressing change.    Vacuna Td (contra el ttanos y la difteria): lo que debe saber Td Vaccine (Tetanus and Diphtheria): What You Need to Know 1. Por qu vacunarse? El ttanos y la difteria son enfermedades muy graves. Son Academic librarian frecuentes en los [place] actualmente, pero las personas que se infectan suelen tener complicaciones graves. La vacuna Td se [place] para proteger a los adolescentes y a los adultos de ambas enfermedades. Tanto el ttanos como la difteria son infecciones causadas por bacterias. La difteria se transmite de persona a persona a travs de la tos o el estornudo. Las bacterias que causan el ttanos entran en el cuerpo a travs de cortes, raspones o heridas. El TTANOS (trismo) provoca entumecimiento y Engineer, materials dolorosa de los msculos, por lo general, en todo el cuerpo.  Puede causar el endurecimiento de los msculos de la cabeza y el cuello, de modo que impide abrir la boca, tragar y, en algunos casos, incluso respirar. El ttanos es causa de muerte en aproximadamente 1de cada 10personas que contraen la infeccin, incluso despus de que reciben la mejor atencin mdica. La DIFTERIA puede hacer que se forme una membrana gruesa en la parte posterior de la garganta.  Puede causar problemas respiratorios, parlisis, insuficiencia cardaca e incluso la muerte. Antes de las vacunas, en los [place] se informaban 200000 casos de difteria y cientos de casos de ttanos cada ao. Desde que comenz la vacunacin, los informes de casos de ambas enfermedades se han reducido en un 99%. 2. Angelica [name] Td La vacuna Td puede proteger a adolescentes y adultos contra el ttanos y la difteria. La vacuna Td habitualmente se aplica como dosis de refuerzo cada 10aos, pero tambin puede administrarse antes si la persona sufre una [place] o herida sucia y grave. A veces, en lugar de la vacuna Td, se recomienda una vacuna llamada Tdap,  que protege contra la tos Webber, adems de proteger contra el ttanos y la difteria. El mdico o la persona que le aplique la vacuna puede darle ms informacin al [employer]. La vacuna Td puede administrarse de manera segura simultneamente con otras vacunas. 3. Algunas personas no deben recibir esta vacuna  Una persona que alguna vez ha tenido una reaccin alrgica potencialmente mortal a una dosis anterior de cualquier vacuna contra el ttanos o la difteria, O que tenga una alergia grave a cualquier parte de esta vacuna, no debe recibir la vacuna Td. Informe a la persona que le aplica la vacuna si tiene cualquier alergia grave.  Consulte con su mdico si: ? tuvo hinchazn o dolor intenso despus de recibir cualquier vacuna contra la difteria o el ttanos, ? alguna vez ha sufrido el sndrome de Pension scheme manager (SGB), ? no se siente Research scientist (life sciences) en que se ha programado la vacuna. 4. Riesgos de [place] reaccin a la vacuna Con cualquier medicamento, incluso las vacunas, existe la posibilidad de que aparezcan efectos secundarios. Suelen ser leves y desaparecen por s solos. Si bien es posible tener [place] graves, estas son Lynnae Sandhoff raras. La Harley-Davidson de las personas a las que se les aplica la vacuna Td no tienen ningn problema. Problemas leves despus de la vacuna Td: (No interfirieron en las actividades)  Art therapist donde se aplic la vacuna (alrededor de 8de cada 10personas)  Enrojecimiento o Paramedic donde se aplic la vacuna (alrededor de 1de cada 4personas)  Fiebre leve (poco frecuente)  Dolor de Training and development officer (alrededor  de 1de cada 4personas)  Cansancio (alrededor de 1de cada 4personas) Problemas moderados despus de la vacuna Td: (Interfirieron en las [place], pero no exigieron atencin [place])  Teacher, English as a foreign language superior a 102F (39C) (poco frecuente) Problemas graves despus de la vacuna Td: (Impidieron Education officer, environmental las actividades habituales y exigieron atencin  mdica)  Buyer, retail, Engineer, mining intenso, sangrado o enrojecimiento en el brazo en que se aplic la vacuna (poco frecuente). Problemas que podran ocurrir despus de cualquier vacuna:  A veces, las personas se desmayan despus de un procedimiento mdico, incluida la vacunacin. Permanecer sentado o recostado durante puede ayudar a [employer] Corporation y las lesiones causadas por las cadas. Informe al mdico si se siente mareado, tiene cambios en la visin o zumbidos en los odos.  Algunas personas sienten un dolor intenso en el hombro y tienen dificultad para mover el brazo donde se aplic la vacuna. Esto sucede con muy poca frecuencia.  Cualquier medicamento puede causar una reaccin alrgica grave. Dichas reacciones son Lynnae Sandhoff poco frecuentes con una vacuna (se calcula que menos de 1en un milln de dosis) y se producen entre unos minutos y unas horas despus de la vacunacin. Al igual que con cualquier Automatic Data, existe una probabilidad muy remota de que una vacuna cause una lesin grave o la Bright. La seguridad de las vacunas se controla permanentemente. Para obtener ms informacin, visite: http://floyd.org/ 5. Qu pasa si hay una reaccin grave? A qu signos debo estar atento?  Est atento a la aparicin de signos preocupantes, como los de una reaccin alrgica grave, fiebre muy alta o comportamiento fuera de lo normal. Los signos de una reaccin alrgica grave pueden incluir ronchas, hinchazn de la cara y la garganta, dificultad para respirar, latidos cardacos acelerados, mareos y debilidad. Generalmente, estos comienzan entre unos pocos minutos y algunas horas despus de la vacunacin. Qu debo hacer?  Si usted piensa que se trata de una reaccin alrgica grave o de otra emergencia que no puede esperar, llame al 9-1-1 o dirjase al hospital ms cercano. De lo contrario, llame al mdico.  Despus, la reaccin debe informarse al Sistema de Informe de Eventos Adversos de  [place] (Vaccine Adverse Event Reporting System, VAERS). El mdico puede presentar este informe, o bien puede hacerlo usted mismo a travs del sitio web de VAERS, en www.vaers.LAgents.no, o llamando al 810-159-8221. El Lordship de Informe de Eventos Adversos de [place] no brinda recomendaciones mdicas. 6. SunTrust de Compensacin de Daos por American Electric Power El [place] de Compensacin de Daos por Administrator, arts (National Vaccine Injury Compensation Program, VICP) es un programa federal que fue creado para Patent examiner a las personas que puedan haber sufrido daos al recibir ciertas vacunas. Aquellas personas que consideren que han sufrido un dao como consecuencia de una vacuna y [place] saber ms acerca del programa y de cmo presentar un reclamo, pueden llamar al [phone] o visitar el sitio web del VICP en SpiritualWord.at. Hay un lmite de tiempo para presentar un reclamo de compensacin. 7. Cmo puedo obtener ms informacin?  Consulte a su mdico. Este puede darle el prospecto de la vacuna o recomendarle otras fuentes de informacin.  Comunquese con el servicio de salud de su localidad o [address].  Comunquese con Building control surveyor for [employer] and Prevention, CDC (Centros para el Control y la Prevencin de [place]): ? Llame al 928-437-4484 (1-800-CDC-INFO) ? Visite el sitio Environmental manager en PicCapture.uy Declaracin de informacin sobre la vacuna Td (09/16/2016) Esta informacin no tiene Theme park manager el consejo del mdico. Manufacturing engineer  de hacerle al mdico cualquier pregunta que tenga. Document Released: 02/15/2009 Document Revised: 07/03/2018 Document Reviewed: 07/03/2018 Elsevier Interactive Patient Education  2019 [employer]vin[employer].

## 2019-01-18 LAB — MICROALBUMIN / CREATININE URINE RATIO
Creatinine, Urine: 87.1 mg/dL
Microalb/Creat Ratio: 14 mg/g creat (ref 0–29)
Microalbumin, Urine: 12.6 ug/mL

## 2019-01-19 LAB — CULTURE, BLOOD (ROUTINE X 2)
Culture: NO GROWTH
Culture: NO GROWTH
Special Requests: ADEQUATE
Special Requests: ADEQUATE

## 2019-01-29 ENCOUNTER — Inpatient Hospital Stay: Payer: Self-pay

## 2020-01-15 ENCOUNTER — Telehealth: Payer: Self-pay | Admitting: Obstetrics and Gynecology

## 2020-01-15 NOTE — Telephone Encounter (Signed)
The health department called in to have the patient scheduled for a new ob intake due to type 2 diabetes. Informed the receptionist of the waitlist and the patient will be called to be scheduled.

## 2020-01-15 NOTE — Telephone Encounter (Signed)
Called the patient with the in office intrepreter Eda to inform of the upcoming appointment with education. Left the patient a voicemail message informing to please call the office.

## 2020-01-16 NOTE — Telephone Encounter (Signed)
Opened in error

## 2020-01-22 ENCOUNTER — Other Ambulatory Visit: Payer: Self-pay

## 2020-01-22 ENCOUNTER — Encounter: Payer: Self-pay | Admitting: Obstetrics and Gynecology

## 2020-01-26 ENCOUNTER — Telehealth: Payer: Self-pay | Admitting: Family Medicine

## 2020-01-26 NOTE — Telephone Encounter (Signed)
Called the patient to inform of the new ob intake visit as the patient was placed on the waitlist. The mobile number on file is the husbands number and he gave directions to call his wife on the home number. Left a voicemail for the patient advising please call our office. Mailing an appointment reminder with the upcoming appointment information. The patient also needs to be rescheduled for the missed education appointment.

## 2020-02-23 ENCOUNTER — Other Ambulatory Visit: Payer: Self-pay

## 2020-02-23 ENCOUNTER — Telehealth: Payer: Self-pay | Admitting: *Deleted

## 2020-02-23 ENCOUNTER — Ambulatory Visit (INDEPENDENT_AMBULATORY_CARE_PROVIDER_SITE_OTHER): Payer: Self-pay | Admitting: *Deleted

## 2020-02-23 ENCOUNTER — Encounter: Payer: Self-pay | Admitting: *Deleted

## 2020-02-23 DIAGNOSIS — O24319 Unspecified pre-existing diabetes mellitus in pregnancy, unspecified trimester: Secondary | ICD-10-CM

## 2020-02-23 DIAGNOSIS — Z98891 History of uterine scar from previous surgery: Secondary | ICD-10-CM | POA: Insufficient documentation

## 2020-02-23 DIAGNOSIS — Z789 Other specified health status: Secondary | ICD-10-CM

## 2020-02-23 DIAGNOSIS — O0990 Supervision of high risk pregnancy, unspecified, unspecified trimester: Secondary | ICD-10-CM | POA: Insufficient documentation

## 2020-02-23 HISTORY — DX: History of uterine scar from previous surgery: Z98.891

## 2020-02-23 MED ORDER — PRENATAL VITAMINS 28-0.8 MG PO TABS: 1.0000 | ORAL_TABLET | Freq: Every day | ORAL | 9 refills | Status: AC

## 2020-02-23 NOTE — Progress Notes (Addendum)
Patient was scheduled for telephone visit for new ob intake but came to office by mistake; so visit done in person. Per chart does not need interpreter ; but patient states thru interpreter she does not speak [place] and needs interpreter. Interpreter obtained. Referred from [facility] ; records not available - will call for records. Patient unsure if she has GDM or DM type 2.  States does not have meter.  I explained I am completing her New OB Intake today. We discussed Her EDD and that I can not determine it until I receive her records- she states they told her 07/24/20 not based on LMP . I reviewed her allergies, meds, OB History, Medical /Surgical history, and appropriate screenings. She does have food and transportation issues- resources put in her AVS. C/o nausea at times- does not want any medication now.  I explained we will have her take her blood pressure weekly during her pregnancy and write it down . Then at each visit she will either show [place] her blood pressures from home or tell us during virtual visits. I gave her a blood pressure cuff and  Showed her how to use it. I instructed her if bp 140/90 or higher to retake in 5 minutes and then call us.  I explained she will have some visits in office and some virtually.  reviewed her new ob  appointment date/ time with her , our location and to wear mask, no visitors.  I explained she will have a pelvic exam, ob bloodwork, hemoglobin a1C, cbg ,pap, and  genetic testing if desired,- she does not want a panorama. I explained we will scheduled an Korea at 19 weeks once we know her EDD.  She voices understanding.  Taylyn Brame,RN

## 2020-02-23 NOTE — Addendum Note (Signed)
Addended byRaynald Blend L on: 02/23/2020 12:00 PM   Modules accepted: Orders

## 2020-02-23 NOTE — Patient Instructions (Signed)
[place] [facility]  Department of Social Services-[place] [address], [place], [place] [address] [phone]   or  www.[place].gov/our-county/human-services/social-services **SNAP/EBT/ Other nutritional benefits  [place] DHHS-Public Health-WIC [address], [place], [place] [address] [phone]) [phone]  or  https://[place].gov/our-county/human-services/health-department **WIC for  women who are pregnant and postpartum, infants and children up to 5 years old  [facility] [address], [place], [place] [address] [phone]   or   www.theblessedtable.org  **Food pantry  [facility] [address], [place], [place] [address] [phone]   or   https://brotherkolbes.godaddysites.com  **Emergency food and prepared meals  [facility] [address], [place], [place] [address] [phone]   or   www.cedargrovetop.us **Food pantry  [facility] [address], [place], [place] [address] [phone]   or   www.facebook.com/[facility]-116430931718202 **Food pantry  [facility] [address], [place], [place] [address] [phone] **Food pantry  [facility] [address], [place], [place] [address] [phone]   or   www.greensborourbanministry.org  **Food pantry and prepared meals  [facility] [address], [place], [place] [address] https://jfsgreensboro.org/  **Food pantry  [facility] [address], [place], [place] [address] [phone]   or   www.lbcnow.org  **Food pantry  [facility] [address], [place], [place] [address] [phone]   or   http://www.onestepfurther.com **Food pantry, nutrition education, gardening activities  [facility] [address], [place], [place] [address] [phone] **Food pantry  [facility] [place] [address], [place], [place] [address] [phone]   or   www.salvationarmyofgreensboro.org **Food pantry  [facility] [place] [address], [place], [place] [address] [phone]   or   http://senior-resources-[place]il[place].org **Meals on Wheels Program  [facility] [address], [place], [place] [address] [phone]   or   www.stmattchurch.com  **Food pantry  [facility] [address], , [place] [address] [phone]   or   vandaliapresbyterianchurch.org **Food pantry  [place] [facility]  [place] [facility] [address], [place], [place] [address] [phone]   or   www.facebook.com/JulianUMC Food pantry  [facility] [address], [place], [place] [address] [phone] **Food pantry  [place] [facility] Resources   Department of Public Health[place] [address], [place], [place] [address] [phone]   or   www.co.Spencer.Boligee.us/ph/  [place] DHHS-Public Health-WIC ([place]) [address], [place], [place] [address] [phone]   or   https://www.[place]nc.gov/our-county/human-services/health-department **WIC for pregnant and postpartum women, infants and children up to 5 years old  [facility] [address], [place], [place] [address] [phone] **Food pantry  [facility] [address] [place] [address], [place], [place] [address] [phone]   or   emerywoodbaptistchurch.com *Food pantry  [facility] [address], [place], [place] [address] [phone]   or   www.fcchighpoint.org **Food pantry  [facility] [address], [place],  [address] [phone]   or    www.helpinghandshp.org **Food pantry  [facility] [address], [place],  [address] [phone]   or   www.facebook.com/KBCI1 **Food Pantry  [facility] [address] [facility]   [address], [place], [place] [address] [phone]   or   www.abbottscreek.org E. [employer]. [employer] of [place] [phone]   **Delivers meals  [facility] [address] [place], [place] [address] [phone]   or   nbfgm.sundaystreamwebsites.com  Tree surgeon of [employer] [address], [place], [place] [address] [phone]   or   www.odm-hp.org  **Food pantry  [address] [place] [facility] [address] [place], [place] [address] [phone]   or   R2live.tv **Food pantry  [facility] [address], [place], [place] [address] [phone]   or   WrestlingMonthly.pl **Emergency food and pet food  [facility] [facility] [address], [place], [place] [address] [phone]   or   www.senioradults.org **Congregate and delivered meals to older adults  [place] [facility] of Greater [employer] [address], [place], [place] [address] [phone]   or   https://www.miller-montoya.com/ **Back Pack Program for elementary school students  [facility] [address] [place], [place] [address] [phone]   or   www.wardstreetcommunityresources.org **Food pantry  [facility] [address] [place], [place] [address] [phone]   or   ResumeQuery.com.ee **Emergency food, nutrition classes, food budgeting  [address] [place], [place], [place] [address] [phone]  or  http://www.[place]countypublichealth.org **WIC for pregnant and postpartum women, infants and children up to 34 years old  Geologist, engineering [employer] (GTA) [address] [place], [place], [place] [address] https://www.[place].gov/departments/transportation/gdot-divisions/[place]-transit-agency-public-transportation-division     . Fixed-route bus services, including regional fare cards for PART, [place], [place], and WSTA buses.  . Reduced fare bus ID's available for Medicaid, Medicare, and "orange card" recipients.  Marland Kitchen SCAT offers curb-to-curb and door-to-door bus services for people with disabilities who are unable to use a fixed-bus route; also offers a shared-ride program.   Helpful tips:  -Routes available online and physical maps available at the main bus hub lobby (each for a specific route) -Smartphone directions often include bus routes (see the "bus" icon, next to the "car" and "walk" icons) -Routes differ on weekends, evenings and holidays, so plan ahead!  -If you have Medicaid, Medicare, or orange card, plan to obtain a reduced-fare ID to save 50% on rides. Check days and times to obtain an ID, and bring all necessary documents.   [employer] System [place]) [address] [place], [place]. [address] [place], [place] [address] [phone] SpotApps.nl **Fixed-bus route services, and demand response bus service for older adults  Department of Social [facility] [address], [place], [place] [address] [phone] www.MysterySinger.com.cy **Medicaid transportation is available to [facility] recipients who need assistance getting to [facility] medical appointments and providers  [facility] [address] [place], [place], [place] [address] www.cjmedicaltransportation.com  ** Offers non-emergency transportation for medical appointments  [employer]els [address] [place], [place] [address] [phone] www.wheels4hope.org **REFERRAL NEEDED by specific agencies (see website), after meeting specified criteria  only  [employer] for [employer]) [address], [place], [place] [address] [phone]  BuyingShow.es  *Regional fixed-bus routes between counties (example: [place] to [place]) and [employer] of [place]  [address], [place], [place] [address] [phone] Http://senior-resources-[place].org  Museum/gallery exhibitions officer available (call or see website for details)  Senior Adults [facility] [address] [place], [place] [address] [phone] www.senioradults.org  **  Ride coordination   If you are in need of transportation to get to and from your appointments in our office.  You can reach Transportation Services by calling [phone] Monday - Friday  7am-6pm.

## 2020-02-23 NOTE — Telephone Encounter (Signed)
I received records from [facility] confirming EDD . I scheduled [place] . I was unable to schedule on same day as other appointments as Obelia requested because there were no [place] appointments then. I called Fiona  With pacific interpreterr# [id number] and left a message we were calling about appointments. Please call us back. If she calls back or we call her, need to also schedule for lab only for prenatal labs for before new ob appt if she can possibly come in . She does have transportation issues and has been given [employer] number.  Maudry Zeidan,RN

## 2020-02-24 ENCOUNTER — Other Ambulatory Visit: Payer: Self-pay

## 2020-02-24 DIAGNOSIS — O0990 Supervision of high risk pregnancy, unspecified, unspecified trimester: Secondary | ICD-10-CM

## 2020-02-25 LAB — PROTEIN / CREATININE RATIO, URINE
Creatinine, Urine: 29.8 mg/dL
Protein, Ur: 5 mg/dL
Protein/Creat Ratio: 168 mg/g creat (ref 0–200)

## 2020-02-26 LAB — HEMOGLOBPATHY+FER W/A THAL RFX
Ferritin: 149 ng/mL (ref 15–150)
Hgb A2: 2.5 % (ref 1.8–3.2)
Hgb A: 97.5 % (ref 96.4–98.8)
Hgb F: 0 % (ref 0.0–2.0)
Hgb S: 0 %

## 2020-02-26 LAB — OBSTETRIC PANEL, INCLUDING HIV
Antibody Screen: NEGATIVE
Basophils Absolute: 0 10*3/uL (ref 0.0–0.2)
Basos: 0 %
EOS (ABSOLUTE): 0.3 10*3/uL (ref 0.0–0.4)
Eos: 3 %
HIV Screen 4th Generation wRfx: NONREACTIVE
Hematocrit: 38 % (ref 34.0–46.6)
Hemoglobin: 13.3 g/dL (ref 11.1–15.9)
Hepatitis B Surface Ag: NEGATIVE
Immature Grans (Abs): 0 10*3/uL (ref 0.0–0.1)
Immature Granulocytes: 0 %
Lymphocytes Absolute: 2.5 10*3/uL (ref 0.7–3.1)
Lymphs: 26 %
MCH: 30 pg (ref 26.6–33.0)
MCHC: 35 g/dL (ref 31.5–35.7)
MCV: 86 fL (ref 79–97)
Monocytes Absolute: 0.5 10*3/uL (ref 0.1–0.9)
Monocytes: 5 %
Neutrophils Absolute: 6.2 10*3/uL (ref 1.4–7.0)
Neutrophils: 66 %
Platelets: 415 10*3/uL (ref 150–450)
RBC: 4.43 x10E6/uL (ref 3.77–5.28)
RDW: 12.2 % (ref 11.7–15.4)
RPR Ser Ql: NONREACTIVE
Rh Factor: POSITIVE
Rubella Antibodies, IGG: 7.3 index (ref >0.99)
WBC: 9.6 10*3/uL (ref 3.4–10.8)

## 2020-02-26 LAB — COMPREHENSIVE METABOLIC PANEL
ALT: 10 IU/L (ref 0–32)
AST: 15 IU/L (ref 0–40)
Albumin/Globulin Ratio: 1.3 (ref 1.2–2.2)
Albumin: 4.1 g/dL (ref 3.8–4.8)
Alkaline Phosphatase: 77 IU/L (ref 39–117)
BUN/Creatinine Ratio: 11 (ref 9–23)
BUN: 6 mg/dL (ref 6–20)
Bilirubin Total: 0.3 mg/dL (ref 0.0–1.2)
CO2: 21 mmol/L (ref 20–29)
Calcium: 9.8 mg/dL (ref 8.7–10.2)
Chloride: 98 mmol/L (ref 96–106)
Creatinine, Ser: 0.55 mg/dL — ABNORMAL LOW (ref 0.57–1.00)
GFR calc Af Amer: 142 mL/min/{1.73_m2} (ref >59)
GFR calc non Af Amer: 123 mL/min/{1.73_m2} (ref >59)
Globulin, Total: 3.1 g/dL (ref 1.5–4.5)
Glucose: 207 mg/dL — ABNORMAL HIGH (ref 65–99)
Potassium: 4.2 mmol/L (ref 3.5–5.2)
Sodium: 134 mmol/L (ref 134–144)
Total Protein: 7.2 g/dL (ref 6.0–8.5)

## 2020-02-26 LAB — HEMOGLOBIN A1C
Est. average glucose Bld gHb Est-mCnc: 229 mg/dL
Hgb A1c MFr Bld: 9.6 % — ABNORMAL HIGH (ref 4.8–5.6)

## 2020-02-26 LAB — HEPATITIS C ANTIBODY: Hep C Virus Ab: 0.1 s/co ratio (ref 0.0–0.9)

## 2020-02-26 LAB — TSH: TSH: 1.54 u[IU]/mL (ref 0.450–4.500)

## 2020-02-26 LAB — CULTURE, OB URINE

## 2020-02-26 LAB — URINE CULTURE, OB REFLEX

## 2020-02-26 NOTE — Telephone Encounter (Signed)
Called patient to verify appointment information and to explain that we were spreading her appointments over a 3 day period because of availability. Patient stated that she was unable to come early to do her labs because of transportation and that she can do them when she comes in for her OB Visit. Explained the course of appointments over the next few days and she verbalized understanding and ensured me that she had the number for transportation.

## 2020-03-03 ENCOUNTER — Other Ambulatory Visit (HOSPITAL_COMMUNITY)
Admission: RE | Admit: 2020-03-03 | Discharge: 2020-03-03 | Disposition: A | Discharge status: Home / Self Care | Payer: Self-pay | Source: Ambulatory Visit | Attending: Family Medicine | Admitting: Family Medicine

## 2020-03-03 ENCOUNTER — Ambulatory Visit (INDEPENDENT_AMBULATORY_CARE_PROVIDER_SITE_OTHER): Payer: Self-pay | Admitting: Family Medicine

## 2020-03-03 ENCOUNTER — Other Ambulatory Visit: Payer: Self-pay

## 2020-03-03 ENCOUNTER — Encounter: Payer: Self-pay | Admitting: Family Medicine

## 2020-03-03 VITALS — BP 102/68 | HR 93 | Wt 129.4 lb

## 2020-03-03 DIAGNOSIS — O0990 Supervision of high risk pregnancy, unspecified, unspecified trimester: Secondary | ICD-10-CM | POA: Insufficient documentation

## 2020-03-03 DIAGNOSIS — O24119 Pre-existing diabetes mellitus, type 2, in pregnancy, unspecified trimester: Secondary | ICD-10-CM | POA: Insufficient documentation

## 2020-03-03 DIAGNOSIS — O24112 Pre-existing diabetes mellitus, type 2, in pregnancy, second trimester: Secondary | ICD-10-CM

## 2020-03-03 DIAGNOSIS — Z789 Other specified health status: Secondary | ICD-10-CM

## 2020-03-03 DIAGNOSIS — Z550 Illiteracy and low-level literacy: Secondary | ICD-10-CM

## 2020-03-03 DIAGNOSIS — Z98891 History of uterine scar from previous surgery: Secondary | ICD-10-CM

## 2020-03-03 MED ORDER — ASPIRIN 81 MG PO CHEW: 81.0000 mg | CHEWABLE_TABLET | Freq: Every day | ORAL | 2 refills | Status: AC

## 2020-03-03 MED ORDER — METFORMIN HCL 500 MG PO TABS: 1000.0000 mg | ORAL_TABLET | Freq: Two times a day (BID) | ORAL | 3 refills | Status: DC

## 2020-03-03 NOTE — Progress Notes (Signed)
Subjective:  Angelica [name] is a 34 y.o. [phone] at [redacted]w[redacted]d being seen today for her new OB visit.  She is currently monitored for the following issues for this high-risk pregnancy and has Abscess and cellulitis of gluteal region; Diabetes mellitus type 2, uncontrolled, without complications; Supervision of high risk pregnancy, antepartum; Preexisting diabetes complicating pregnancy, antepartum; History of C-section; Language barrier; Pre-existing type 2 diabetes mellitus during pregnancy; and Illiterate on their problem list.  Patient reports no complaints.  Contractions: Not present. Vag. Bleeding: None.  Movement: Present. Denies leaking of fluid.   The following portions of the patient's history were reviewed and updated as appropriate: allergies, current medications, past family history, past medical history, past social history, past surgical history and problem list. Problem list updated.  Objective:   Vitals:   03/03/20 1019  BP: 102/68  Pulse: 93  Weight: 129 lb 6.4 oz (58.7 kg)    Fetal Status: Fetal Heart Rate (bpm): 161   Movement: Present     General:  Alert, oriented and cooperative. Patient is in no acute distress.  Skin: Skin is warm and dry. No rash noted.   Cardiovascular: Normal heart rate noted  Respiratory: Normal respiratory effort, no problems with respiration noted  Abdomen: Soft, gravid, appropriate for gestational age. Pain/Pressure: Absent     Pelvic: Vag. Bleeding: None     Pap smear obtained        Extremities: Normal range of motion.  Edema: None  Mental Status: Normal mood and affect. Normal behavior. Normal judgment and thought content.   Urinalysis:    obtained  Assessment and Plan:  Pregnancy: [id number] at [redacted]w[redacted]d  1. Supervision of high risk pregnancy, antepartum - Patient oriented to practice - Comprehensive metabolic panel - Culture, OB Urine - Hemoglobin A1c - Obstetric Panel, Including HIV - Protein / creatinine ratio, urine - TSH -  Cytology - PAP( [place]) - Ambulatory referral to Ophthalmology - Will need Fetal ECHO after 20 weeks  2. History of C-section - With 2nd baby, 5 VBACs after - Desires TOLAC  3. Language barrier - Spanish interpreter used for this encounter  4. Pre-existing type 2 diabetes mellitus during pregnancy in second trimester - A1c 02/24/20 was 9.6 - Explained to patient diagnosis of T2DM and importance of glycemic control. Patient resistant to diagnosis, endorses she does not have a lot of money for medications, and reports that she eats a healthy diet. She is not currently taking medication. - Metformin and Aspirin 81 mg prescribed  5. Illiteracy - Patient endorses that she does not know how to read, will have her 12 year old son help her pick up her prescriptions   Preterm labor symptoms and general obstetric precautions including but not limited to vaginal bleeding, contractions, leaking of fluid and fetal movement were reviewed in detail with the patient. Please refer to After Visit Summary for other counseling recommendations.  No follow-ups on file.   Gail Vendetti L, DO

## 2020-03-03 NOTE — Patient Instructions (Signed)
Tratamiento con insulina para la diabetes mellitus Insulin Treatment for Diabetes Mellitus La diabetes (diabetes mellitus) es una enfermedad de larga duracin (crnica). Se produce cuando el cuerpo no utiliza Occupational hygienist (glucosa) que se libera de los alimentos despus de la digestin. Una hormona llamada insulina controla los niveles de glucosa. La insulina se produce en el pncreas, un rgano ubicado detrs del [place].  Si tiene diabetes tipo1, debe recibir insulina porque el pncreas no produce insulina.  Si tiene diabetes tipo2, es posible que tenga que recibir insulina junto con otros medicamentos. En la diabetes tipo 2, puede presentarse uno de los siguientes problemas, o ambos: ? El pncreas no produce la cantidad suficiente de insulina. ? Las clulas del cuerpo no responden de [place] a la insulina que el organismo produce (resistencia a la insulina). Para mantener la diabetes bajo control, tiene que recibir insulina del modo correcto. Debe tener algo de insulina en el organismo en todo momento. El tratamiento con insulina vara en funcin del tipo de diabetes que tenga, los objetivos del tratamiento y los antecedentes mdicos. Haga preguntas para entender su plan de tratamiento con insulina, a fin de que pueda participar activamente en el control de la diabetes. Cmo se administra la insulina? La insulina solo puede administrarse mediante una inyeccin. Se inyecta con [place] y [place], [place] pluma para [place], [place] bomba de [place] o un inyector de insulina tipo jet. Su mdico:  Le recetar el tipo y la cantidad de insulina que necesita.  Le indicar cundo debe inyectarse la insulina. En qu lugar del cuerpo debe inyectarse la insulina? La insulina se inyecta debajo de la piel en una capa de tejido adiposo. Para inyectar la insulina, los lugares adecuados son los siguientes:  El abdomen. Generalmente, el abdomen es el mejor lugar para Scientist, clinical (histocompatibility and immunogenetics)  insulina. Sin embargo, Nurse, adult las zonas que estn a menos de 2pulgadas (5cm) de distancia del ombligo.  La cara anterior del muslo.  La cara superior y externa del muslo.  La cara superior y externa del brazo.  La cara superior y externa de la nalga. Es importante que:  Se aplique la inyeccin en un lugar ligeramente diferente cada vez. Esto ayuda a Dietitian y a Investment banker, operational.  No se aplique las inyecciones en las zonas donde tenga tejido cicatricial. Por lo general, usted mismo se aplicar las inyecciones de insulina. Tambin es posible ensearles a otras personas cmo aplicarlas. Usar un tipo de Academic librarian solo para insulina. Algunas personas pueden usar una bomba que administra insulina de forma continua a travs de un tubo (cnula) que se coloca debajo de la piel. Cules son los diferentes tipos de insulina? La informacin a continuacin es una gua general de los distintos tipos de [place]. Los detalles especficos varan en funcin de la insulina que el mdico le recete.  Insulina de accin rpida: ? Comienza a actuar rpidamente, en apenas 26minutos. ? Su efecto puede durar de 4 a 6horas (o a veces ms tiempo). ? Es eficaz cuando se aplica justo antes de una comida para bajar la glucemia rpidamente.  Insulina de accin corta: ? Comienza a actuar en aproximadamente 62minutos. ? Su efecto puede durar de 6 a 10horas. ? Debe aplicarse aproximadamente 30 minutos antes de empezar a comer.  Insulina de accin intermedia: ? Comienza a actuar en el trmino de 1 o 2horas. ? Su efecto dura aproximadamente de 10 a 18horas. ? Este tipo de [place], mantiene baja la  glucemia durante ms tiempo, pero no es tan eficaz para reducir los niveles justo despus de comer.  Insulina de accin prolongada: ? Imita la pequea cantidad de insulina que el pncreas produce habitualmente Administrator. ? Debe aplicarse una o dos veces al da. ? Por lo  general, se utiliza en combinacin con otros tipos de insulina u otros medicamentos.  Insulina concentrada o insulina U-500: ? Contiene una dosis ms alta de insulina que la mayora de las insulinas de accin rpida. La insulina U-500 contiene 5veces la cantidad de insulina por 23ml. ? Se la debe aplicar nicamente con la jeringa especial de U-500 o la pluma para insulina U-500. Con esta insulina, es peligroso usar el tipo equivocado de [place]. Cules son los efectos secundarios de la insulina? Los posibles efectos secundarios del tratamiento con insulina incluyen los siguientes:  Glucemia baja (hipoglucemia).  Aumento de [place].  Glucemia alta (hiperglucemia).  Lesin o irritacin de la piel. Algunos de [employer] secundarios pueden deberse al uso de la tcnica de inyeccin incorrecta. Asegrese de aprender cmo inyectar la insulina correctamente. Cules son los trminos frecuentes asociados con el tratamiento con insulina? Algunos de los trminos que puede or [employer] siguientes:  Insulina basal o tasa basal. Es la cantidad constante de insulina que debe estar presente en el cuerpo para mantener estable el nivel de glucemia. Las personas con diabetes tipo1 necesitan la insulina basal en una dosis constante (continua) las 24horas del Futures trader. ? Generalmente, la insulina de accin intermedia o la de Ship broker se aplica una o dos veces al da, a fin de [employer] niveles de insulina basal bajo control. ? Tambin se puede recomendar la administracin de medicamentos por va oral para mantener los niveles de insulina basal bajo control.  Insulina prandial o nutricional. Es la insulina relacionada con las comidas. ? La glucemia sube rpidamente despus de una comida (posprandial). Se puede usar la insulina de accin rpida o de accin corta justo antes de una comida (preprandial) para bajar el nivel de glucemia con rapidez. ? Es posible que le indiquen que ajuste la cantidad de  insulina prandial que se administra en funcin de la cantidad de hidratos de carbono (almidones) que ingiera en la comida.  Insulina correctora. Tambin puede denominarse dosis de correccin o complementaria. Se trata de una pequea cantidad de insulina de accin rpida o de accin corta que se puede administrar para Copy nivel de glucemia cuando est muy alto. Pueden indicarle que se controle la glucemia en determinados momentos del da y que se aplique insulina correctora si es necesario.  Control estricto o tratamiento intensivo. Consiste en mantener el nivel de glucemia lo ms cerca posible del objetivo y Automotive engineer que suban demasiado los niveles despus de las comidas. Las personas bajo control estricto de la diabetes tienen menos problemas a largo plazo a causa de esta enfermedad. Siga estas indicaciones en su casa: Hable con el mdico o el farmacutico sobre el tipo de insulina que debe aplicarse y cundo debe [place]. Debe saber cundo la insulina alcanza su valor mximo (pico) y cundo su efecto es ms bajo. Necesita esta informacin para poder planificar las comidas y la actividad fsica. Colabore con su mdico con respecto a lo siguiente:  Librarian, academic de glucemia todos los [place]. El Firefighter la frecuencia y el momento de [place].  Mantenga bajo control: ? Su peso. ? La presin arterial. ? El colesterol. ? El estrs.  Siga una dieta saludable.  Hacer ejercicio regularmente. Resumen  La diabetes es una enfermedad de larga duracin (crnica). Se produce cuando el cuerpo no utiliza Artist (glucosa) que se libera de los alimentos despus de la digestin. Una hormona llamada insulina controla los niveles de glucosa. La insulina se produce en un rgano ubicado detrs del [place]mago (el pncreas).  Para mantener la diabetes bajo control, tiene que recibir insulina del modo correcto. Debe tener algo de insulina en el organismo en todo momento.  El tratamiento  con insulina vara en funcin del tipo de diabetes que tenga, los objetivos del tratamiento y los antecedentes mdicos.  Hable con el mdico o el farmacutico sobre el tipo de insulina que debe aplicarse y cundo debe [place].  Controle su nivel de glucemia todos [place]. El Firefighter la frecuencia y el momento de Forensic scientist. Esta informacin no tiene Theme park manager el consejo del mdico. Asegrese de hacerle al mdico cualquier pregunta que tenga. Document Revised: 07/31/2017 Document Reviewed: 12/03/2015 Elsevier Patient Education  2020 [employer].

## 2020-03-04 ENCOUNTER — Ambulatory Visit: Payer: Self-pay | Admitting: Registered"

## 2020-03-04 ENCOUNTER — Encounter: Payer: Self-pay | Attending: Family Medicine | Admitting: Registered"

## 2020-03-04 DIAGNOSIS — O24119 Pre-existing diabetes mellitus, type 2, in pregnancy, unspecified trimester: Secondary | ICD-10-CM | POA: Insufficient documentation

## 2020-03-04 DIAGNOSIS — O24112 Pre-existing diabetes mellitus, type 2, in pregnancy, second trimester: Secondary | ICD-10-CM

## 2020-03-04 DIAGNOSIS — Z3A00 Weeks of gestation of pregnancy not specified: Secondary | ICD-10-CM | POA: Insufficient documentation

## 2020-03-04 LAB — PROTEIN / CREATININE RATIO, URINE
Creatinine, Urine: 107.7 mg/dL
Protein, Ur: 14.1 mg/dL
Protein/Creat Ratio: 131 mg/g creat (ref 0–200)

## 2020-03-04 LAB — CYTOLOGY - PAP
Chlamydia: NEGATIVE
Comment: NEGATIVE
Comment: NEGATIVE
Comment: NORMAL
Diagnosis: NEGATIVE
High risk HPV: NEGATIVE
Neisseria Gonorrhea: NEGATIVE

## 2020-03-04 NOTE — Progress Notes (Signed)
Interpreter services provided by Eda from [facility]  Patient was seen on 03/04/20 for Gestational Diabetes self-management. EDD 08/03/20. Patient states no history of GDM. Diet history obtained. Patient has limited intake due to continued nausea and food insecurity. Pt states she eats mostly vegetables, some meat, noodles and some fruit. Pt reports she eats a lot of plants such as chipilin and blackberry nightshade leaves. Pt reports her children do the cooking. Beverages include water - only bottled, sometimes runs out.  Patient is not likely consuming excess carbohydrates.   Patient picked up Metformin yesterday and started taking it last night.  Pt states she had a back ache 20 min after taking medication. Pt states this morning her back hurt but not as much.   Patient understood the directions as taking metformin 500 mg BID. However, per Epic Rx is 1000 mg BID. RD consulted with MD present (Dr. Rip Harbour), and patient is to continue with 500 mg BID until her appointment next week due to her GI issues.  RD explained to patient that it is likely that the metformin will not be enough to help her control the blood sugar and explained how injecting insulin is very effective during pregnancy. Patient does not want to take insulin and would rather take a pill. Dr. Rip Harbour states he may consider Glyburide next week after seeing her blood sugar log to re-evaluate  Patient has language and literacy barriers. Pt states she will have children read the column headers of the blood glucose log if she forgets what and where she is supposed to record her blood sugar readings.   The following learning objectives were met by the patient :   Demonstrates proper blood glucose monitoring techniques  Identify the foods that can raise blood sugar.  Role of insulin to lower blood sugar  Plan:   Continue taking Metformin 500 mg BID with meals Begin checking Blood Glucose before breakfast and 2 hours after first bite of  breakfast, lunch and dinner as directed by MD  Bring Log Book/Sheet and meter to every medical appointment Patient not appropriate for Baby Scripts due to language barrier   Blood glucose monitor given: Prodigy Blood glucose reading: 172 mg/dL  Patient instructed to monitor glucose levels: FBS: 60 - 95 mg/dl 2 hour: <120 mg/dl  Patient received the following handouts:  Nutrition Diabetes and Pregnancy  Carbohydrate Counting List  Blood glucose Log Sheet  Patient will be seen for follow-up in as needed.

## 2020-03-05 ENCOUNTER — Other Ambulatory Visit: Payer: Self-pay

## 2020-03-05 ENCOUNTER — Ambulatory Visit (HOSPITAL_COMMUNITY)
Admission: RE | Admit: 2020-03-05 | Discharge: 2020-03-05 | Disposition: A | Discharge status: Home / Self Care | Payer: Self-pay | Source: Ambulatory Visit | Attending: Family Medicine | Admitting: Family Medicine

## 2020-03-05 ENCOUNTER — Encounter (HOSPITAL_COMMUNITY): Payer: Self-pay

## 2020-03-05 ENCOUNTER — Other Ambulatory Visit (HOSPITAL_COMMUNITY): Payer: Self-pay | Admitting: *Deleted

## 2020-03-05 ENCOUNTER — Ambulatory Visit (HOSPITAL_COMMUNITY): Payer: Self-pay | Admitting: *Deleted

## 2020-03-05 DIAGNOSIS — O34219 Maternal care for unspecified type scar from previous cesarean delivery: Secondary | ICD-10-CM

## 2020-03-05 DIAGNOSIS — O0990 Supervision of high risk pregnancy, unspecified, unspecified trimester: Secondary | ICD-10-CM | POA: Insufficient documentation

## 2020-03-05 DIAGNOSIS — Z789 Other specified health status: Secondary | ICD-10-CM | POA: Insufficient documentation

## 2020-03-05 DIAGNOSIS — Z98891 History of uterine scar from previous surgery: Secondary | ICD-10-CM | POA: Insufficient documentation

## 2020-03-05 DIAGNOSIS — O24319 Unspecified pre-existing diabetes mellitus in pregnancy, unspecified trimester: Secondary | ICD-10-CM | POA: Insufficient documentation

## 2020-03-05 DIAGNOSIS — Z3A18 18 weeks gestation of pregnancy: Secondary | ICD-10-CM

## 2020-03-05 DIAGNOSIS — Z363 Encounter for antenatal screening for malformations: Secondary | ICD-10-CM

## 2020-03-05 DIAGNOSIS — O24119 Pre-existing diabetes mellitus, type 2, in pregnancy, unspecified trimester: Secondary | ICD-10-CM

## 2020-03-05 DIAGNOSIS — O0942 Supervision of pregnancy with grand multiparity, second trimester: Secondary | ICD-10-CM

## 2020-03-05 DIAGNOSIS — O24112 Pre-existing diabetes mellitus, type 2, in pregnancy, second trimester: Secondary | ICD-10-CM

## 2020-03-05 DIAGNOSIS — Z7984 Long term (current) use of oral hypoglycemic drugs: Secondary | ICD-10-CM

## 2020-03-09 ENCOUNTER — Encounter: Payer: Self-pay | Admitting: Obstetrics and Gynecology

## 2020-03-29 ENCOUNTER — Other Ambulatory Visit: Payer: Self-pay

## 2020-03-29 ENCOUNTER — Ambulatory Visit (INDEPENDENT_AMBULATORY_CARE_PROVIDER_SITE_OTHER): Payer: Self-pay | Admitting: Family Medicine

## 2020-03-29 VITALS — BP 108/63 | HR 85 | Wt 133.3 lb

## 2020-03-29 DIAGNOSIS — Z3A21 21 weeks gestation of pregnancy: Secondary | ICD-10-CM

## 2020-03-29 DIAGNOSIS — O0990 Supervision of high risk pregnancy, unspecified, unspecified trimester: Secondary | ICD-10-CM

## 2020-03-29 DIAGNOSIS — O0992 Supervision of high risk pregnancy, unspecified, second trimester: Secondary | ICD-10-CM

## 2020-03-29 DIAGNOSIS — Z603 Acculturation difficulty: Secondary | ICD-10-CM

## 2020-03-29 DIAGNOSIS — Z550 Illiteracy and low-level literacy: Secondary | ICD-10-CM

## 2020-03-29 DIAGNOSIS — Z789 Other specified health status: Secondary | ICD-10-CM

## 2020-03-29 DIAGNOSIS — O24112 Pre-existing diabetes mellitus, type 2, in pregnancy, second trimester: Secondary | ICD-10-CM

## 2020-03-29 DIAGNOSIS — Z98891 History of uterine scar from previous surgery: Secondary | ICD-10-CM

## 2020-03-29 MED ORDER — INSULIN NPH (HUMAN) (ISOPHANE) 100 UNIT/ML ~~LOC~~ SUSP
5.0000 [IU] | Freq: Two times a day (BID) | SUBCUTANEOUS | 3 refills | Status: DC
Start: 2020-03-29 — End: 2020-06-30

## 2020-03-29 MED ORDER — "INSULIN SYRINGE 31G X 5/16"" 0.5 ML MISC": 1.0000 [IU] | Freq: Two times a day (BID) | 3 refills | Status: DC

## 2020-03-29 NOTE — Progress Notes (Signed)
U/S 2 weeks ago at health department

## 2020-03-29 NOTE — Progress Notes (Signed)
   PRENATAL VISIT NOTE  Subjective:  Angelica [name] is a 34 y.o. [phone] at [redacted]w[redacted]d being seen today for ongoing prenatal care.  She is currently monitored for the following issues for this high-risk pregnancy and has Abscess and cellulitis of gluteal region; Diabetes mellitus type 2, uncontrolled, without complications; Supervision of high risk pregnancy, antepartum; Preexisting diabetes complicating pregnancy, antepartum; History of C-section; Language barrier; Pre-existing type 2 diabetes mellitus during pregnancy; and Illiterate on their problem list.  Patient reports round ligament pain.  Contractions: Not present. Vag. Bleeding: None.  Movement: Present. Denies leaking of fluid.   The following portions of the patient's history were reviewed and updated as appropriate: allergies, current medications, past family history, past medical history, past social history, past surgical history and problem list.   Objective:   Vitals:   03/29/20 1033  BP: 108/63  Pulse: 85  Weight: 133 lb 4.8 oz (60.5 kg)    Fetal Status: Fetal Heart Rate (bpm): 154   Movement: Present     General:  Alert, oriented and cooperative. Patient is in no acute distress.  Skin: Skin is warm and dry. No rash noted.   Cardiovascular: Normal heart rate noted  Respiratory: Normal respiratory effort, no problems with respiration noted  Abdomen: Soft, gravid, appropriate for gestational age.  Pain/Pressure: Present     Pelvic: Cervical exam deferred        Extremities: Normal range of motion.  Edema: None  Mental Status: Normal mood and affect. Normal behavior. Normal judgment and thought content.   Assessment and Plan:  Pregnancy: [id number] at [redacted]w[redacted]d  1. Supervision of high risk pregnancy, antepartum FHT and FH normal  2. Pre-existing type 2 diabetes mellitus during pregnancy in second trimester Had fetal echo 5/13 - normal. Taking metformin 1000mg  BID.  Testing CBGs 4 times a day and checking 2hr  PP. Majority of fasting CBGs over last week elevated. Majority of 2hr GTT elevated. Start Insulin NPH 5 units BID (fasting and bedtime). DUe to illiteracy, will schedule teaching for insulin injection Serial .   3. History of C-section S/p 5 VBAC. Desires TOLAC  4. Language barrier Interpreter used  5. Illiterate    Preterm labor symptoms and general obstetric precautions including but not limited to vaginal bleeding, contractions, leaking of fluid and fetal movement were reviewed in detail with the patient. Please refer to After Visit Summary for other counseling recommendations.   No follow-ups on file.  Future Appointments  Date Time Provider Department Center  04/02/2020  1:15 PM [facility] NURSE [facility]  04/02/2020  1:15 PM [facility]  04/08/2020  9:55 AM 04/10/2020, Penne Lash, MD [facility]    [facility] DO

## 2020-03-30 ENCOUNTER — Other Ambulatory Visit: Payer: Self-pay | Admitting: *Deleted

## 2020-03-30 DIAGNOSIS — O24119 Pre-existing diabetes mellitus, type 2, in pregnancy, unspecified trimester: Secondary | ICD-10-CM

## 2020-03-31 ENCOUNTER — Other Ambulatory Visit: Payer: Self-pay

## 2020-04-01 ENCOUNTER — Encounter: Payer: Self-pay | Attending: Family Medicine | Admitting: Registered"

## 2020-04-01 ENCOUNTER — Ambulatory Visit: Payer: Self-pay | Admitting: Registered"

## 2020-04-01 DIAGNOSIS — Z3A00 Weeks of gestation of pregnancy not specified: Secondary | ICD-10-CM | POA: Insufficient documentation

## 2020-04-01 DIAGNOSIS — O24319 Unspecified pre-existing diabetes mellitus in pregnancy, unspecified trimester: Secondary | ICD-10-CM

## 2020-04-01 DIAGNOSIS — O24119 Pre-existing diabetes mellitus, type 2, in pregnancy, unspecified trimester: Secondary | ICD-10-CM | POA: Insufficient documentation

## 2020-04-01 NOTE — Progress Notes (Signed)
InterpreterFredrich Romans [phone] via [employer] video services  Insulin Instruction  Patient was seen on 04/01/20 for insulin instruction.  MD orders are: NPH 5 units BID 8 am and 10 pm  Pt states her husband will inject the morning dose before he leaves for work at 7:30 am.  The following learning objectives were met by the patient during this visit:   Insulin Action of NPH insulin  Reviewed syringe & vial including # units per syringe and vial  Hygiene and storage  Drawing up single doses   Rotation of Sites  Hypoglycemia- symptoms, causes, treatment choices  Record keeping and MD follow up  Patient demonstrated understanding of insulin administration by return demonstration.  Patient received the following handouts:  Insulin Instruction Handout - BD Drawing and Self-inject Insulin. (step by step using pictures)                                        Patient to start on insulin as Rx'd by MD  Pt reports she will have to wait until tomorrow to pick up insulin because her husband gets paid on Fridays.  Patient will be seen for follow-up in as needed.

## 2020-04-02 ENCOUNTER — Other Ambulatory Visit: Payer: Self-pay | Admitting: *Deleted

## 2020-04-02 ENCOUNTER — Other Ambulatory Visit: Payer: Self-pay

## 2020-04-02 ENCOUNTER — Ambulatory Visit: Payer: Self-pay | Admitting: *Deleted

## 2020-04-02 ENCOUNTER — Ambulatory Visit (HOSPITAL_COMMUNITY): Payer: Self-pay | Attending: Obstetrics and Gynecology

## 2020-04-02 DIAGNOSIS — O34219 Maternal care for unspecified type scar from previous cesarean delivery: Secondary | ICD-10-CM

## 2020-04-02 DIAGNOSIS — O24119 Pre-existing diabetes mellitus, type 2, in pregnancy, unspecified trimester: Secondary | ICD-10-CM

## 2020-04-02 DIAGNOSIS — Z789 Other specified health status: Secondary | ICD-10-CM | POA: Insufficient documentation

## 2020-04-02 DIAGNOSIS — O0990 Supervision of high risk pregnancy, unspecified, unspecified trimester: Secondary | ICD-10-CM | POA: Insufficient documentation

## 2020-04-02 DIAGNOSIS — O24319 Unspecified pre-existing diabetes mellitus in pregnancy, unspecified trimester: Secondary | ICD-10-CM | POA: Insufficient documentation

## 2020-04-02 DIAGNOSIS — Z98891 History of uterine scar from previous surgery: Secondary | ICD-10-CM

## 2020-04-02 DIAGNOSIS — O24112 Pre-existing diabetes mellitus, type 2, in pregnancy, second trimester: Secondary | ICD-10-CM

## 2020-04-02 DIAGNOSIS — Z3A22 22 weeks gestation of pregnancy: Secondary | ICD-10-CM

## 2020-04-02 DIAGNOSIS — Z7984 Long term (current) use of oral hypoglycemic drugs: Secondary | ICD-10-CM | POA: Insufficient documentation

## 2020-04-02 DIAGNOSIS — O0942 Supervision of pregnancy with grand multiparity, second trimester: Secondary | ICD-10-CM

## 2020-04-02 DIAGNOSIS — Z362 Encounter for other antenatal screening follow-up: Secondary | ICD-10-CM

## 2020-04-06 ENCOUNTER — Other Ambulatory Visit: Payer: Self-pay

## 2020-04-08 ENCOUNTER — Encounter: Payer: Self-pay | Admitting: Obstetrics & Gynecology

## 2020-04-08 ENCOUNTER — Encounter: Payer: Self-pay | Admitting: *Deleted

## 2020-04-30 ENCOUNTER — Ambulatory Visit: Payer: Self-pay | Attending: Obstetrics and Gynecology

## 2020-04-30 ENCOUNTER — Ambulatory Visit: Payer: Self-pay | Admitting: *Deleted

## 2020-04-30 ENCOUNTER — Other Ambulatory Visit: Payer: Self-pay | Admitting: *Deleted

## 2020-04-30 ENCOUNTER — Other Ambulatory Visit: Payer: Self-pay

## 2020-04-30 DIAGNOSIS — Z98891 History of uterine scar from previous surgery: Secondary | ICD-10-CM

## 2020-04-30 DIAGNOSIS — O24112 Pre-existing diabetes mellitus, type 2, in pregnancy, second trimester: Secondary | ICD-10-CM

## 2020-04-30 DIAGNOSIS — O24319 Unspecified pre-existing diabetes mellitus in pregnancy, unspecified trimester: Secondary | ICD-10-CM

## 2020-04-30 DIAGNOSIS — O34219 Maternal care for unspecified type scar from previous cesarean delivery: Secondary | ICD-10-CM

## 2020-04-30 DIAGNOSIS — O24119 Pre-existing diabetes mellitus, type 2, in pregnancy, unspecified trimester: Secondary | ICD-10-CM | POA: Insufficient documentation

## 2020-04-30 DIAGNOSIS — Z3A26 26 weeks gestation of pregnancy: Secondary | ICD-10-CM

## 2020-04-30 DIAGNOSIS — O0990 Supervision of high risk pregnancy, unspecified, unspecified trimester: Secondary | ICD-10-CM

## 2020-04-30 DIAGNOSIS — O0942 Supervision of pregnancy with grand multiparity, second trimester: Secondary | ICD-10-CM

## 2020-04-30 DIAGNOSIS — Z789 Other specified health status: Secondary | ICD-10-CM | POA: Insufficient documentation

## 2020-04-30 DIAGNOSIS — Z362 Encounter for other antenatal screening follow-up: Secondary | ICD-10-CM

## 2020-04-30 NOTE — Progress Notes (Signed)
Pt states that her blood sugars have been better, did not bring log today.

## 2020-05-28 ENCOUNTER — Other Ambulatory Visit: Payer: Self-pay

## 2020-05-28 ENCOUNTER — Ambulatory Visit: Payer: Self-pay | Attending: Obstetrics and Gynecology

## 2020-05-28 ENCOUNTER — Ambulatory Visit: Payer: Self-pay | Admitting: *Deleted

## 2020-05-28 ENCOUNTER — Ambulatory Visit: Payer: Self-pay

## 2020-05-28 DIAGNOSIS — O24319 Unspecified pre-existing diabetes mellitus in pregnancy, unspecified trimester: Secondary | ICD-10-CM

## 2020-05-28 DIAGNOSIS — O34219 Maternal care for unspecified type scar from previous cesarean delivery: Secondary | ICD-10-CM

## 2020-05-28 DIAGNOSIS — O0990 Supervision of high risk pregnancy, unspecified, unspecified trimester: Secondary | ICD-10-CM

## 2020-05-28 DIAGNOSIS — Z98891 History of uterine scar from previous surgery: Secondary | ICD-10-CM

## 2020-05-28 DIAGNOSIS — O24414 Gestational diabetes mellitus in pregnancy, insulin controlled: Secondary | ICD-10-CM

## 2020-05-28 DIAGNOSIS — Z362 Encounter for other antenatal screening follow-up: Secondary | ICD-10-CM

## 2020-05-28 DIAGNOSIS — Z794 Long term (current) use of insulin: Secondary | ICD-10-CM | POA: Insufficient documentation

## 2020-05-28 DIAGNOSIS — Z3A30 30 weeks gestation of pregnancy: Secondary | ICD-10-CM

## 2020-05-28 DIAGNOSIS — Z789 Other specified health status: Secondary | ICD-10-CM

## 2020-05-28 DIAGNOSIS — O24113 Pre-existing diabetes mellitus, type 2, in pregnancy, third trimester: Secondary | ICD-10-CM

## 2020-05-28 DIAGNOSIS — O0943 Supervision of pregnancy with grand multiparity, third trimester: Secondary | ICD-10-CM

## 2020-05-28 DIAGNOSIS — O24119 Pre-existing diabetes mellitus, type 2, in pregnancy, unspecified trimester: Secondary | ICD-10-CM | POA: Insufficient documentation

## 2020-05-29 LAB — HEMOGLOBIN A1C
Est. average glucose Bld gHb Est-mCnc: 151 mg/dL
Hgb A1c MFr Bld: 6.9 % — ABNORMAL HIGH (ref 4.8–5.6)

## 2020-05-31 ENCOUNTER — Other Ambulatory Visit: Payer: Self-pay | Admitting: *Deleted

## 2020-05-31 DIAGNOSIS — O24119 Pre-existing diabetes mellitus, type 2, in pregnancy, unspecified trimester: Secondary | ICD-10-CM

## 2020-06-11 ENCOUNTER — Ambulatory Visit: Payer: Self-pay | Admitting: *Deleted

## 2020-06-11 ENCOUNTER — Ambulatory Visit: Payer: Self-pay | Attending: Obstetrics and Gynecology

## 2020-06-11 ENCOUNTER — Encounter: Payer: Self-pay | Admitting: Obstetrics and Gynecology

## 2020-06-11 ENCOUNTER — Other Ambulatory Visit: Payer: Self-pay

## 2020-06-11 ENCOUNTER — Other Ambulatory Visit: Payer: Self-pay | Admitting: *Deleted

## 2020-06-11 DIAGNOSIS — O24319 Unspecified pre-existing diabetes mellitus in pregnancy, unspecified trimester: Secondary | ICD-10-CM

## 2020-06-11 DIAGNOSIS — O24119 Pre-existing diabetes mellitus, type 2, in pregnancy, unspecified trimester: Secondary | ICD-10-CM | POA: Insufficient documentation

## 2020-06-11 DIAGNOSIS — O24313 Unspecified pre-existing diabetes mellitus in pregnancy, third trimester: Secondary | ICD-10-CM

## 2020-06-11 DIAGNOSIS — Z98891 History of uterine scar from previous surgery: Secondary | ICD-10-CM

## 2020-06-11 DIAGNOSIS — Z3A32 32 weeks gestation of pregnancy: Secondary | ICD-10-CM

## 2020-06-11 DIAGNOSIS — O0990 Supervision of high risk pregnancy, unspecified, unspecified trimester: Secondary | ICD-10-CM | POA: Insufficient documentation

## 2020-06-11 DIAGNOSIS — Z794 Long term (current) use of insulin: Secondary | ICD-10-CM | POA: Insufficient documentation

## 2020-06-11 DIAGNOSIS — Z789 Other specified health status: Secondary | ICD-10-CM | POA: Insufficient documentation

## 2020-06-11 DIAGNOSIS — O34219 Maternal care for unspecified type scar from previous cesarean delivery: Secondary | ICD-10-CM

## 2020-06-11 DIAGNOSIS — E119 Type 2 diabetes mellitus without complications: Secondary | ICD-10-CM

## 2020-06-11 DIAGNOSIS — O0943 Supervision of pregnancy with grand multiparity, third trimester: Secondary | ICD-10-CM

## 2020-06-18 ENCOUNTER — Ambulatory Visit: Payer: Self-pay | Attending: Obstetrics and Gynecology

## 2020-06-18 ENCOUNTER — Other Ambulatory Visit: Payer: Self-pay

## 2020-06-18 ENCOUNTER — Ambulatory Visit: Payer: Self-pay | Admitting: *Deleted

## 2020-06-18 DIAGNOSIS — Z3A33 33 weeks gestation of pregnancy: Secondary | ICD-10-CM

## 2020-06-18 DIAGNOSIS — O24319 Unspecified pre-existing diabetes mellitus in pregnancy, unspecified trimester: Secondary | ICD-10-CM | POA: Insufficient documentation

## 2020-06-18 DIAGNOSIS — O24119 Pre-existing diabetes mellitus, type 2, in pregnancy, unspecified trimester: Secondary | ICD-10-CM | POA: Insufficient documentation

## 2020-06-18 DIAGNOSIS — O34219 Maternal care for unspecified type scar from previous cesarean delivery: Secondary | ICD-10-CM

## 2020-06-18 DIAGNOSIS — Z789 Other specified health status: Secondary | ICD-10-CM

## 2020-06-18 DIAGNOSIS — O0990 Supervision of high risk pregnancy, unspecified, unspecified trimester: Secondary | ICD-10-CM | POA: Insufficient documentation

## 2020-06-18 DIAGNOSIS — Z98891 History of uterine scar from previous surgery: Secondary | ICD-10-CM | POA: Insufficient documentation

## 2020-06-18 DIAGNOSIS — O0943 Supervision of pregnancy with grand multiparity, third trimester: Secondary | ICD-10-CM

## 2020-06-18 DIAGNOSIS — Z794 Long term (current) use of insulin: Secondary | ICD-10-CM | POA: Insufficient documentation

## 2020-06-18 DIAGNOSIS — O24113 Pre-existing diabetes mellitus, type 2, in pregnancy, third trimester: Secondary | ICD-10-CM

## 2020-06-23 ENCOUNTER — Encounter: Payer: Self-pay | Admitting: Obstetrics and Gynecology

## 2020-06-23 ENCOUNTER — Ambulatory Visit (INDEPENDENT_AMBULATORY_CARE_PROVIDER_SITE_OTHER): Payer: Self-pay | Admitting: Obstetrics and Gynecology

## 2020-06-23 ENCOUNTER — Other Ambulatory Visit: Payer: Self-pay

## 2020-06-23 VITALS — BP 100/59 | HR 78 | Wt 138.4 lb

## 2020-06-23 DIAGNOSIS — O0990 Supervision of high risk pregnancy, unspecified, unspecified trimester: Secondary | ICD-10-CM

## 2020-06-23 DIAGNOSIS — O24113 Pre-existing diabetes mellitus, type 2, in pregnancy, third trimester: Secondary | ICD-10-CM

## 2020-06-23 DIAGNOSIS — Z641 Problems related to multiparity: Secondary | ICD-10-CM

## 2020-06-23 DIAGNOSIS — Z98891 History of uterine scar from previous surgery: Secondary | ICD-10-CM

## 2020-06-23 DIAGNOSIS — Z3A34 34 weeks gestation of pregnancy: Secondary | ICD-10-CM

## 2020-06-23 DIAGNOSIS — Z789 Other specified health status: Secondary | ICD-10-CM

## 2020-06-23 NOTE — Progress Notes (Signed)
Prenatal Visit Note Date: 06/23/2020 Clinic: [facility]  Subjective:  Anastazja Isaac is a 34 y.o. [id number] at [redacted]w[redacted]d being seen today for ongoing prenatal care.  She is currently monitored for the following issues for this high-risk pregnancy and has Abscess and cellulitis of gluteal region; Diabetes mellitus type 2, uncontrolled, without complications; Supervision of high risk pregnancy, antepartum; History of VBAC; Language barrier; Pre-existing type 2 diabetes mellitus during pregnancy; Illiterate; and Grand multipara on their problem list.  Patient reports no complaints.   Contractions: Irritability. Vag. Bleeding: None.  Movement: Present. Denies leaking of fluid.   The following portions of the patient's history were reviewed and updated as appropriate: allergies, current medications, past family history, past medical history, past social history, past surgical history and problem list. Problem list updated.  Objective:   Vitals:   06/23/20 0829  BP: (!) 100/59  Pulse: 78  Weight: 138 lb 6.4 oz (62.8 kg)    Fetal Status: Fetal Heart Rate (bpm): 139   Movement: Present     General:  Alert, oriented and cooperative. Patient is in no acute distress.  Skin: Skin is warm and dry. No rash noted.   Cardiovascular: Normal heart rate noted  Respiratory: Normal respiratory effort, no problems with respiration noted  Abdomen: Soft, gravid, appropriate for gestational age. Pain/Pressure: Present     Pelvic:  Cervical exam deferred        Extremities: Normal range of motion.  Edema: None  Mental Status: Normal mood and affect. Normal behavior. Normal judgment and thought content.   Urinalysis:      Assessment and Plan:  Pregnancy: [id number] at [redacted]w[redacted]d  1. Supervision of high risk pregnancy, antepartum Routine care. 28wk labs today. Pt is self pay. Desires depo provera. Confirms on low dose asa.  - HIV antibody (with reflex) - CBC - RPR  2. Grand  multipara Active management of third stage  3. Pre-existing type 2 diabetes mellitus during pregnancy in third trimester Normal CBG log with metformin 1000 bid and nph with breakfast and qhs. Continue with weekly testing and monthly growth u/s.   4. History of VBAC Desires another vbac. Form signed today  5. Language barrier Interpreter used and read tolac form with her  Preterm labor symptoms and general obstetric precautions including but not limited to vaginal bleeding, contractions, leaking of fluid and fetal movement were reviewed in detail with the patient. Please refer to After Visit Summary for other counseling recommendations.  Return in about 1 week (around 06/30/2020) for high risk, in person.   [place] Bing, MD

## 2020-06-24 LAB — CBC
Hematocrit: 38 % (ref 34.0–46.6)
Hemoglobin: 12.8 g/dL (ref 11.1–15.9)
MCH: 28.6 pg (ref 26.6–33.0)
MCHC: 33.7 g/dL (ref 31.5–35.7)
MCV: 85 fL (ref 79–97)
Platelets: 351 10*3/uL (ref 150–450)
RBC: 4.47 x10E6/uL (ref 3.77–5.28)
RDW: 12.9 % (ref 11.7–15.4)
WBC: 8.4 10*3/uL (ref 3.4–10.8)

## 2020-06-24 LAB — HIV ANTIBODY (ROUTINE TESTING W REFLEX): HIV Screen 4th Generation wRfx: NONREACTIVE

## 2020-06-24 LAB — RPR: RPR Ser Ql: NONREACTIVE

## 2020-06-25 ENCOUNTER — Other Ambulatory Visit: Payer: Self-pay

## 2020-06-25 ENCOUNTER — Ambulatory Visit: Payer: Self-pay | Attending: Obstetrics and Gynecology

## 2020-06-25 ENCOUNTER — Ambulatory Visit: Payer: Self-pay | Admitting: *Deleted

## 2020-06-25 DIAGNOSIS — Z362 Encounter for other antenatal screening follow-up: Secondary | ICD-10-CM

## 2020-06-25 DIAGNOSIS — O0990 Supervision of high risk pregnancy, unspecified, unspecified trimester: Secondary | ICD-10-CM | POA: Insufficient documentation

## 2020-06-25 DIAGNOSIS — O0943 Supervision of pregnancy with grand multiparity, third trimester: Secondary | ICD-10-CM

## 2020-06-25 DIAGNOSIS — E119 Type 2 diabetes mellitus without complications: Secondary | ICD-10-CM | POA: Insufficient documentation

## 2020-06-25 DIAGNOSIS — Z789 Other specified health status: Secondary | ICD-10-CM

## 2020-06-25 DIAGNOSIS — Z98891 History of uterine scar from previous surgery: Secondary | ICD-10-CM | POA: Insufficient documentation

## 2020-06-25 DIAGNOSIS — Z3A34 34 weeks gestation of pregnancy: Secondary | ICD-10-CM

## 2020-06-25 DIAGNOSIS — Z794 Long term (current) use of insulin: Secondary | ICD-10-CM | POA: Insufficient documentation

## 2020-06-25 DIAGNOSIS — O24113 Pre-existing diabetes mellitus, type 2, in pregnancy, third trimester: Secondary | ICD-10-CM

## 2020-06-30 ENCOUNTER — Ambulatory Visit (INDEPENDENT_AMBULATORY_CARE_PROVIDER_SITE_OTHER): Payer: Self-pay | Admitting: Obstetrics and Gynecology

## 2020-06-30 ENCOUNTER — Other Ambulatory Visit: Payer: Self-pay

## 2020-06-30 ENCOUNTER — Other Ambulatory Visit (HOSPITAL_COMMUNITY)
Admission: RE | Admit: 2020-06-30 | Discharge: 2020-06-30 | Disposition: A | Discharge status: Home / Self Care | Payer: Self-pay | Source: Ambulatory Visit | Attending: Obstetrics and Gynecology | Admitting: Obstetrics and Gynecology

## 2020-06-30 ENCOUNTER — Encounter: Payer: Self-pay | Admitting: Obstetrics and Gynecology

## 2020-06-30 VITALS — BP 96/63 | HR 85 | Wt 137.9 lb

## 2020-06-30 DIAGNOSIS — Z23 Encounter for immunization: Secondary | ICD-10-CM

## 2020-06-30 DIAGNOSIS — O0990 Supervision of high risk pregnancy, unspecified, unspecified trimester: Secondary | ICD-10-CM

## 2020-06-30 DIAGNOSIS — Z789 Other specified health status: Secondary | ICD-10-CM

## 2020-06-30 DIAGNOSIS — O3663X0 Maternal care for excessive fetal growth, third trimester, not applicable or unspecified: Secondary | ICD-10-CM | POA: Insufficient documentation

## 2020-06-30 DIAGNOSIS — Z3A35 35 weeks gestation of pregnancy: Secondary | ICD-10-CM

## 2020-06-30 DIAGNOSIS — Z641 Problems related to multiparity: Secondary | ICD-10-CM

## 2020-06-30 DIAGNOSIS — Z98891 History of uterine scar from previous surgery: Secondary | ICD-10-CM

## 2020-06-30 DIAGNOSIS — O24113 Pre-existing diabetes mellitus, type 2, in pregnancy, third trimester: Secondary | ICD-10-CM

## 2020-06-30 MED ORDER — INSULIN NPH (HUMAN) (ISOPHANE) 100 UNIT/ML ~~LOC~~ SUSP: 5.0000 [IU] | Freq: Two times a day (BID) | SUBCUTANEOUS | 3 refills | Status: DC

## 2020-06-30 NOTE — Progress Notes (Addendum)
Prenatal Visit Note Date: 06/30/2020 Clinic: [facility]  Subjective:  Angelica [name] is a 34 y.o. [id number] at [redacted]w[redacted]d being seen today for ongoing prenatal care.  She is currently monitored for the following issues for this high-risk pregnancy and has Diabetes mellitus type 2, uncontrolled, without complications; Supervision of high risk pregnancy, antepartum; History of VBAC; Language barrier; Pre-existing type 2 diabetes mellitus during pregnancy; Illiterate; and Grand multipara on their problem list.  Patient reports occasional contractions.   Contractions: Irregular. Vag. Bleeding: None.  Movement: Present. Denies leaking of fluid.   The following portions of the patient's history were reviewed and updated as appropriate: allergies, current medications, past family history, past medical history, past social history, past surgical history and problem list. Problem list updated.  Objective:   Vitals:   06/30/20 1319  BP: 96/63  Pulse: 85  Weight: 137 lb 14.4 oz (62.6 kg)    Fetal Status: Fetal Heart Rate (bpm): 138   Movement: Present     General:  Alert, oriented and cooperative. Patient is in no acute distress.  Skin: Skin is warm and dry. No rash noted.   Cardiovascular: Normal heart rate noted  Respiratory: Normal respiratory effort, no problems with respiration noted  Abdomen: Soft, gravid, appropriate for gestational age. Pain/Pressure: Present     Pelvic:  Cervical exam performed Dilation: 1 Effacement (%): 50 Station: Ballotable  Extremities: Normal range of motion.  Edema: None  Mental Status: Normal mood and affect. Normal behavior. Normal judgment and thought content.   Urinalysis:      Assessment and Plan:  Pregnancy: [id number] at [redacted]w[redacted]d  1. Pre-existing type 2 diabetes mellitus during pregnancy in third trimester On nph 5/5 and metformin 500 bid. Am fasting in the 60s-70s and 2h PP in the 120s. Pt is also LGA based on growth last week. I d/w  her re: shoulder dystocia risk. Largest prior was 8 and half pounds per patient and no issues with delivery. I d/w her that we will have extra help in the room but I don't feel she'll be big enough to recommend a c/s based on size alone especially since I recommend 38wk IOL given having to adjust her insulin today to nph 8 and 5 and keep metformin at 500 bid. Pt amenable to plan Set up IOL nv.  Has rpt bpp in two days  2. Grand multipara  3. History of VBAC Signed form already  4. Language barrier Interpreter used  5. Supervision of high risk pregnancy, antepartum tdap today - Strep Gp B NAA - Cervicovaginal ancillary only( [place])  6. Excessive fetal growth affecting management of pregnancy in third trimester, single or unspecified fetus See above.   Preterm labor symptoms and general obstetric precautions including but not limited to vaginal bleeding, contractions, leaking of fluid and fetal movement were reviewed in detail with the patient. Please refer to After Visit Summary for other counseling recommendations.  Return in about 1 week (around 07/07/2020) for high risk, in person.   [place] Bing, MD

## 2020-06-30 NOTE — Addendum Note (Signed)
Addended by: Mercy Moore on: 06/30/2020 02:20 PM   Modules accepted: Orders

## 2020-07-01 LAB — CERVICOVAGINAL ANCILLARY ONLY
Chlamydia: NEGATIVE
Comment: NEGATIVE
Comment: NORMAL
Neisseria Gonorrhea: NEGATIVE

## 2020-07-02 ENCOUNTER — Encounter: Payer: Self-pay | Admitting: *Deleted

## 2020-07-02 ENCOUNTER — Other Ambulatory Visit: Payer: Self-pay

## 2020-07-02 ENCOUNTER — Ambulatory Visit: Payer: Self-pay | Attending: Obstetrics and Gynecology

## 2020-07-02 ENCOUNTER — Ambulatory Visit: Payer: Self-pay | Admitting: *Deleted

## 2020-07-02 DIAGNOSIS — Z98891 History of uterine scar from previous surgery: Secondary | ICD-10-CM | POA: Insufficient documentation

## 2020-07-02 DIAGNOSIS — Z3A35 35 weeks gestation of pregnancy: Secondary | ICD-10-CM

## 2020-07-02 DIAGNOSIS — Z789 Other specified health status: Secondary | ICD-10-CM

## 2020-07-02 DIAGNOSIS — O0943 Supervision of pregnancy with grand multiparity, third trimester: Secondary | ICD-10-CM

## 2020-07-02 DIAGNOSIS — Z362 Encounter for other antenatal screening follow-up: Secondary | ICD-10-CM

## 2020-07-02 DIAGNOSIS — O34219 Maternal care for unspecified type scar from previous cesarean delivery: Secondary | ICD-10-CM

## 2020-07-02 DIAGNOSIS — Z794 Long term (current) use of insulin: Secondary | ICD-10-CM

## 2020-07-02 DIAGNOSIS — Z7984 Long term (current) use of oral hypoglycemic drugs: Secondary | ICD-10-CM

## 2020-07-02 DIAGNOSIS — O24313 Unspecified pre-existing diabetes mellitus in pregnancy, third trimester: Secondary | ICD-10-CM

## 2020-07-02 DIAGNOSIS — O0990 Supervision of high risk pregnancy, unspecified, unspecified trimester: Secondary | ICD-10-CM

## 2020-07-02 DIAGNOSIS — E119 Type 2 diabetes mellitus without complications: Secondary | ICD-10-CM | POA: Insufficient documentation

## 2020-07-02 LAB — STREP GP B NAA: Strep Gp B NAA: NEGATIVE

## 2020-07-05 ENCOUNTER — Other Ambulatory Visit: Payer: Self-pay | Admitting: *Deleted

## 2020-07-05 DIAGNOSIS — O24113 Pre-existing diabetes mellitus, type 2, in pregnancy, third trimester: Secondary | ICD-10-CM

## 2020-07-08 ENCOUNTER — Other Ambulatory Visit: Payer: Self-pay

## 2020-07-08 ENCOUNTER — Ambulatory Visit (INDEPENDENT_AMBULATORY_CARE_PROVIDER_SITE_OTHER): Payer: Self-pay | Admitting: Obstetrics and Gynecology

## 2020-07-08 ENCOUNTER — Encounter: Payer: Self-pay | Admitting: Obstetrics and Gynecology

## 2020-07-08 VITALS — Wt 139.9 lb

## 2020-07-08 DIAGNOSIS — O0990 Supervision of high risk pregnancy, unspecified, unspecified trimester: Secondary | ICD-10-CM

## 2020-07-08 DIAGNOSIS — O24113 Pre-existing diabetes mellitus, type 2, in pregnancy, third trimester: Secondary | ICD-10-CM

## 2020-07-08 DIAGNOSIS — Z550 Illiteracy and low-level literacy: Secondary | ICD-10-CM

## 2020-07-08 DIAGNOSIS — Z98891 History of uterine scar from previous surgery: Secondary | ICD-10-CM

## 2020-07-08 DIAGNOSIS — O3663X0 Maternal care for excessive fetal growth, third trimester, not applicable or unspecified: Secondary | ICD-10-CM

## 2020-07-08 LAB — POCT URINALYSIS DIP (DEVICE)
Bilirubin Urine: NEGATIVE
Glucose, UA: NEGATIVE mg/dL
Hgb urine dipstick: NEGATIVE
Ketones, ur: NEGATIVE mg/dL
Leukocytes,Ua: NEGATIVE
Nitrite: NEGATIVE
Protein, ur: NEGATIVE mg/dL
Specific Gravity, Urine: 1.02 (ref 1.005–1.030)
Urobilinogen, UA: 0.2 mg/dL (ref 0.0–1.0)
pH: 7 (ref 5.0–8.0)

## 2020-07-08 NOTE — Progress Notes (Signed)
Subjective:  Angelica [name] is a 34 y.o. [phone] at [redacted]w[redacted]d being seen today for ongoing prenatal care.  She is currently monitored for the following issues for this high-risk pregnancy and has Diabetes mellitus type 2, uncontrolled, without complications; Supervision of high risk pregnancy, antepartum; History of VBAC; Language barrier; Pre-existing type 2 diabetes mellitus during pregnancy; Illiterate; Grand multipara; and Excessive fetal growth affecting management of mother in third trimester, antepartum on their problem list.  Patient reports general discomforts of pregnancy.  Contractions: Irregular. Vag. Bleeding: None.  Movement: Present. Denies leaking of fluid.   The following portions of the patient's history were reviewed and updated as appropriate: allergies, current medications, past family history, past medical history, past social history, past surgical history and problem list. Problem list updated.  Objective:   Vitals:   07/08/20 1320  Weight: 63.5 kg    Fetal Status:     Movement: Present     General:  Alert, oriented and cooperative. Patient is in no acute distress.  Skin: Skin is warm and dry. No rash noted.   Cardiovascular: Normal heart rate noted  Respiratory: Normal respiratory effort, no problems with respiration noted  Abdomen: Soft, gravid, appropriate for gestational age. Pain/Pressure: Present     Pelvic:  Cervical exam performed        Extremities: Normal range of motion.  Edema: None  Mental Status: Normal mood and affect. Normal behavior. Normal judgment and thought content.   Urinalysis:      Assessment and Plan:  Pregnancy: [id number] at [redacted]w[redacted]d  1. Supervision of high risk pregnancy, antepartum Stable Labor precautions IOL scheduled for 38 weeks  2. Pre-existing type 2 diabetes mellitus during pregnancy in third trimester CBG's stable Continue with current management BPP tomorrow   3. History of VBAC Desires TOLAC, consent   5. Excessive  fetal growth affecting management of pregnancy in third trimester, single or unspecified fetus 99 % growth on 06/25/20  AC > 99 %  IOL at 38 weeks  Term labor symptoms and general obstetric precautions including but not limited to vaginal bleeding, contractions, leaking of fluid and fetal movement were reviewed in detail with the patient. Please refer to After Visit Summary for other counseling recommendations.  Return in about 1 week (around 07/15/2020) for OB visit, face to face, MD only.   Hermina Staggers, MD

## 2020-07-08 NOTE — Patient Instructions (Signed)
Tercer trimestre de embarazo Third Trimester of Pregnancy  El tercer trimestre comprende desde la semana28 hasta la semana40 (desde el mes7 hasta el mes9). En este trimestre, el beb en gestacin (feto) crece muy rpidamente. Hacia el final del noveno mes, el beb en gestacin mide alrededor de 20pulgadas (45cm) de largo. Pesa entre 6y 10libras (2,70y 4,50kg). Siga estas indicaciones en su casa: Medicamentos  Tome los medicamentos de venta libre y los recetados solamente como se lo haya indicado el mdico. Algunos medicamentos son seguros para tomar durante el embarazo y otros no lo son.  Tome vitaminas prenatales que contengan por lo menos 600microgramos (?g) de cido flico.  Si tiene dificultad para mover el intestino (estreimiento), tome un medicamento para ablandar las heces (laxante) si su mdico se lo autoriza. Comida y bebida   Ingiera alimentos saludables de manera regular.  No coma carne cruda ni quesos sin cocinar.  Si obtiene poca cantidad de calcio de los alimentos que ingiere, consulte a su mdico sobre la posibilidad de tomar un suplemento diario de calcio.  La ingesta diaria de cuatro o cinco comidas pequeas en lugar de tres comidas abundantes.  Evite el consumo de alimentos ricos en grasas y azcares, como los alimentos fritos y los dulces.  Para evitar el estreimiento: ? Consuma alimentos ricos en fibra, como frutas y verduras frescas, cereales integrales y frijoles. ? Beba suficiente lquido para mantener el pis (orina) claro o de color amarillo plido. Actividad  Haga ejercicios solamente como se lo haya indicado el mdico. Interrumpa la actividad fsica si comienza a tener calambres.  No levante objetos pesados, use zapatos de tacones bajos y sintese derecha.  No haga ejercicio si hace demasiado calor, hay demasiada humedad o se encuentra en un lugar de mucha altura (altitud alta).  Puede continuar teniendo relaciones sexuales, a menos que el  mdico le indique lo contrario. Alivio del dolor y del malestar  Use un sostn que le brinde buen soporte si sus mamas estn sensibles.  Haga pausas frecuentes y descanse con las piernas levantadas si tiene calambres en las piernas o dolor en la zona lumbar.  Dese baos de asiento con agua tibia para aliviar el dolor o las molestias causadas por las hemorroides. Use una crema para las hemorroides si el mdico la autoriza.  Si desarrolla venas hinchadas y abultadas (vrices) en las piernas: ? Use medias de compresin o medias de descanso como se lo haya indicado el mdico. ? Levante (eleve) los pies durante 15minutos, 3 o 4veces por da. ? Limite el consumo de sal en sus alimentos. Seguridad  Colquese el cinturn de seguridad cuando conduzca.  Haga una lista de los nmeros de telfono de emergencia, que incluya los nmeros de telfono de familiares, amigos, el hospital, as como los departamentos de polica y bomberos. Preparacin para la llegada del beb Para prepararse para la llegada de su beb:  Tome clases prenatales.  Practique ir manejando al hospital.  Visite el hospital y recorra el rea de maternidad.  Hable en su trabajo acerca de tomar licencia cuando llegue el beb.  Prepare el bolso que llevar al hospital.  Prepare la habitacin del beb.  Concurra a los controles mdicos.  Compre un asiento de seguridad orientado hacia atrs para llevar al beb en el automvil. Aprenda cmo instalarlo en el auto. Instrucciones generales  No se d baos de inmersin en agua caliente, baos turcos ni saunas.  No consuma ningn producto que contenga nicotina o tabaco, como cigarrillos y cigarrillos   electrnicos. Si necesita ayuda para dejar de fumar, consulte al mdico.  No beba alcohol.  No se haga duchas vaginales ni use tampones o toallas higinicas perfumadas.  No mantenga las piernas cruzadas durante mucho tiempo.  No haga viajes de larga distancia, excepto si es  obligatorio. Hgalos solamente si su mdico la autoriza.  Visite a su dentista si no lo ha hecho durante el embarazo. Use un cepillo de cerdas suaves para cepillarse los dientes. Psese el hilo dental con suavidad.  Evite el contacto con las bandejas sanitarias de los gatos y la tierra que estos animales usan. Estos elementos contienen bacterias que pueden causar defectos congnitos al beb y la posible prdida del beb (aborto espontneo) o la muerte fetal.  Concurra a todas las visitas prenatales como se lo haya indicado el mdico. Esto es importante. Comunquese con un mdico si:  No est segura de si est en trabajo de parto o si ha roto la bolsa de las aguas.  Tiene mareos.  Tiene clicos leves o siente presin en la parte baja del vientre.  Sufre un dolor persistente en el abdomen.  Sigue teniendo malestar estomacal, vomita o tiene heces lquidas.  Advierte un lquido con olor ftido que proviene de la vagina.  Siente dolor al orinar. Solicite ayuda de inmediato si:  Tiene fiebre.  Tiene una prdida de lquido por la vagina.  Tiene sangrado o pequeas prdidas vaginales.  Siente dolor intenso o clicos en el abdomen.  Aumenta o baja de peso rpidamente.  Tiene dificultades para recuperar el aliento y siente dolor en el pecho.  Sbitamente se le hinchan mucho el rostro, las manos, los tobillos, los pies o las piernas.  No ha sentido los movimientos del beb durante una hora.  Siente un dolor de cabeza intenso que no se alivia con medicamentos.  Tiene dificultad para ver.  Tiene prdida de lquido o le sale un chorro de lquido de la vagina antes de estar en la semana 37.  Tiene espasmos abdominales (contracciones) regulares antes de estar en la semana 37. Resumen  El tercer trimestre comprende desde la semana28 hasta la semana40 (desde el mes7 hasta el mes9). Esta es la poca en que el beb en gestacin crece muy rpidamente.  Siga los consejos del mdico  con respecto a los medicamentos, la alimentacin y la actividad.  Preprese para la llegada del beb tomando las clases prenatales, preparando todo lo que necesitar el beb, arreglando la habitacin del beb y concurriendo a los controles mdicos.  Solicite ayuda de inmediato si tiene sangrado por la vagina, siente dolor en el pecho o tiene dificultad para respirar, o si no ha sentido que su beb se mueve en el transcurso de ms de una hora. Esta informacin no tiene como fin reemplazar el consejo del mdico. Asegrese de hacerle al mdico cualquier pregunta que tenga. Document Revised: 06/04/2017 Document Reviewed: 06/04/2017 Elsevier Patient Education  2020 Elsevier Inc.  

## 2020-07-08 NOTE — Progress Notes (Addendum)
Spanish Interpreter Eda R.  IOL scheduling form faxed to L&D.  Received successful transmission.  Pt informed that someone from L&D will call her with appt details.  Pt verbalized understanding.   Addison Naegeli, RN  07/08/20

## 2020-07-09 ENCOUNTER — Ambulatory Visit: Payer: Self-pay | Admitting: *Deleted

## 2020-07-09 ENCOUNTER — Ambulatory Visit: Payer: Self-pay | Attending: Obstetrics and Gynecology

## 2020-07-09 DIAGNOSIS — Z789 Other specified health status: Secondary | ICD-10-CM

## 2020-07-09 DIAGNOSIS — E119 Type 2 diabetes mellitus without complications: Secondary | ICD-10-CM | POA: Insufficient documentation

## 2020-07-09 DIAGNOSIS — O0990 Supervision of high risk pregnancy, unspecified, unspecified trimester: Secondary | ICD-10-CM | POA: Insufficient documentation

## 2020-07-09 DIAGNOSIS — Z98891 History of uterine scar from previous surgery: Secondary | ICD-10-CM

## 2020-07-09 DIAGNOSIS — O34219 Maternal care for unspecified type scar from previous cesarean delivery: Secondary | ICD-10-CM

## 2020-07-09 DIAGNOSIS — Z3A36 36 weeks gestation of pregnancy: Secondary | ICD-10-CM

## 2020-07-09 DIAGNOSIS — O24313 Unspecified pre-existing diabetes mellitus in pregnancy, third trimester: Secondary | ICD-10-CM

## 2020-07-09 DIAGNOSIS — O0943 Supervision of pregnancy with grand multiparity, third trimester: Secondary | ICD-10-CM

## 2020-07-09 DIAGNOSIS — Z794 Long term (current) use of insulin: Secondary | ICD-10-CM | POA: Insufficient documentation

## 2020-07-13 ENCOUNTER — Telehealth (HOSPITAL_COMMUNITY): Payer: Self-pay | Admitting: *Deleted

## 2020-07-13 NOTE — Telephone Encounter (Signed)
[phone] interpreter number

## 2020-07-14 ENCOUNTER — Other Ambulatory Visit: Payer: Self-pay

## 2020-07-14 ENCOUNTER — Ambulatory Visit (INDEPENDENT_AMBULATORY_CARE_PROVIDER_SITE_OTHER): Payer: Self-pay | Admitting: Family Medicine

## 2020-07-14 ENCOUNTER — Other Ambulatory Visit: Payer: Self-pay | Admitting: Advanced Practice Midwife

## 2020-07-14 VITALS — BP 107/67 | HR 74 | Wt 143.0 lb

## 2020-07-14 DIAGNOSIS — Z641 Problems related to multiparity: Secondary | ICD-10-CM

## 2020-07-14 DIAGNOSIS — Z789 Other specified health status: Secondary | ICD-10-CM

## 2020-07-14 DIAGNOSIS — Z98891 History of uterine scar from previous surgery: Secondary | ICD-10-CM

## 2020-07-14 DIAGNOSIS — O3663X0 Maternal care for excessive fetal growth, third trimester, not applicable or unspecified: Secondary | ICD-10-CM

## 2020-07-14 DIAGNOSIS — O0990 Supervision of high risk pregnancy, unspecified, unspecified trimester: Secondary | ICD-10-CM

## 2020-07-14 DIAGNOSIS — Z603 Acculturation difficulty: Secondary | ICD-10-CM

## 2020-07-14 DIAGNOSIS — O24113 Pre-existing diabetes mellitus, type 2, in pregnancy, third trimester: Secondary | ICD-10-CM

## 2020-07-14 DIAGNOSIS — Z550 Illiteracy and low-level literacy: Secondary | ICD-10-CM

## 2020-07-14 NOTE — Progress Notes (Signed)
   PRENATAL VISIT NOTE  Subjective:  Angelica [name] is a 34 y.o. [phone] at [redacted]w[redacted]d being seen today for ongoing prenatal care.  She is currently monitored for the following issues for this high-risk pregnancy and has Diabetes mellitus type 2, uncontrolled, without complications; Supervision of high risk pregnancy, antepartum; History of VBAC; Language barrier; Pre-existing type 2 diabetes mellitus during pregnancy; Illiterate; Grand multipara; and Excessive fetal growth affecting management of mother in third trimester, antepartum on their problem list.  Patient reports no complaints.  Contractions: Irregular. Vag. Bleeding: None.  Movement: Present. Denies leaking of fluid.   Type 2 DM:  -a1c 03/03/20 was 9.6-->05/28/20 was 6.9 -currently taking metformin 500 mg BID -insulin 8U qAM and 5U qPM -has made diet modifications -FBGL:60-95 -PP BGL:119-122  The following portions of the patient's history were reviewed and updated as appropriate: allergies, current medications, past family history, past medical history, past social history, past surgical history and problem list.   Objective:   Vitals:   07/14/20 1426  BP: 107/67  Pulse: 74  Weight: 143 lb (64.9 kg)    Fetal Status: Fetal Heart Rate (bpm): 149   Movement: Present     General:  Alert, oriented and cooperative. Patient is in no acute distress.  Skin: Skin is warm and dry. No rash noted.   Cardiovascular: Normal heart rate noted  Respiratory: Normal respiratory effort, no problems with respiration noted  Abdomen: Soft, gravid, appropriate for gestational age.  Pain/Pressure: Absent     Pelvic: Cervical exam deferred        Extremities: Normal range of motion.  Edema: None  Mental Status: Normal mood and affect. Normal behavior. Normal judgment and thought content.   Assessment and Plan:  Pregnancy: [id number] at [redacted]w[redacted]d  Excessive fetal growth affecting management of pregnancy in third trimester, single or unspecified  fetus LGA, last u/s 06/25/20 ([redacted]w[redacted]d) 3262g (>99%ile)  Supervision of high risk pregnancy, antepartum -Taking PNV and bASA -No complaints today -IOL scheduled 07/20/20  Grand multipara  Pre-existing type 2 diabetes mellitus during pregnancy in third trimester Poorly controlled, last a1c was 6.9 in 05/2020. Currently compliant with meds. BGL well controlled currently. BPP scheduled 07/16/20.  History of VBAC X5. VBAC consent signed 06/23/20, in media tab.  Language barrier Illiterate Spanish interpreter used for visit.   Term labor symptoms and general obstetric precautions including but not limited to vaginal bleeding, contractions, leaking of fluid and fetal movement were reviewed in detail with the patient. Please refer to After Visit Summary for other counseling recommendations.   No follow-ups on file.  Future Appointments  Date Time Provider Department Center  07/16/2020  3:30 PM [facility] NURSE [facility]  07/16/2020  3:45 PM [facility] US6 [facility]  07/17/2020 11:00 AM [facility]-SCREENING [facility] None  07/20/2020  8:55 AM [facility]-LD SCHED ROOM [facility]INDC None  07/22/2020  1:35 PM Malachy Chamber, MD [facility]    Alric Seton, MD

## 2020-07-16 ENCOUNTER — Other Ambulatory Visit: Payer: Self-pay

## 2020-07-16 ENCOUNTER — Ambulatory Visit: Payer: Self-pay | Attending: Obstetrics and Gynecology

## 2020-07-16 ENCOUNTER — Ambulatory Visit: Payer: Self-pay | Admitting: *Deleted

## 2020-07-16 ENCOUNTER — Other Ambulatory Visit: Payer: Self-pay | Admitting: Obstetrics

## 2020-07-16 ENCOUNTER — Encounter: Payer: Self-pay | Admitting: *Deleted

## 2020-07-16 DIAGNOSIS — O0943 Supervision of pregnancy with grand multiparity, third trimester: Secondary | ICD-10-CM

## 2020-07-16 DIAGNOSIS — O34219 Maternal care for unspecified type scar from previous cesarean delivery: Secondary | ICD-10-CM

## 2020-07-16 DIAGNOSIS — Z98891 History of uterine scar from previous surgery: Secondary | ICD-10-CM

## 2020-07-16 DIAGNOSIS — O24113 Pre-existing diabetes mellitus, type 2, in pregnancy, third trimester: Secondary | ICD-10-CM | POA: Insufficient documentation

## 2020-07-16 DIAGNOSIS — Z3A37 37 weeks gestation of pregnancy: Secondary | ICD-10-CM

## 2020-07-16 DIAGNOSIS — O24313 Unspecified pre-existing diabetes mellitus in pregnancy, third trimester: Secondary | ICD-10-CM

## 2020-07-16 DIAGNOSIS — Z789 Other specified health status: Secondary | ICD-10-CM | POA: Insufficient documentation

## 2020-07-16 DIAGNOSIS — O0990 Supervision of high risk pregnancy, unspecified, unspecified trimester: Secondary | ICD-10-CM

## 2020-07-17 ENCOUNTER — Other Ambulatory Visit (HOSPITAL_COMMUNITY)
Admission: RE | Admit: 2020-07-17 | Discharge: 2020-07-17 | Disposition: A | Discharge status: Home / Self Care | Payer: HRSA Program | Source: Ambulatory Visit | Attending: Family Medicine | Admitting: Family Medicine

## 2020-07-17 DIAGNOSIS — Z20822 Contact with and (suspected) exposure to covid-19: Secondary | ICD-10-CM | POA: Insufficient documentation

## 2020-07-17 DIAGNOSIS — Z01812 Encounter for preprocedural laboratory examination: Secondary | ICD-10-CM | POA: Diagnosis present

## 2020-07-17 LAB — SARS CORONAVIRUS 2 (TAT 6-24 HRS): SARS Coronavirus 2: NEGATIVE

## 2020-07-20 ENCOUNTER — Inpatient Hospital Stay (HOSPITAL_COMMUNITY): Payer: Medicaid Other | Admitting: Certified Registered Nurse Anesthetist

## 2020-07-20 ENCOUNTER — Other Ambulatory Visit: Payer: Self-pay

## 2020-07-20 ENCOUNTER — Encounter (HOSPITAL_COMMUNITY): Payer: Self-pay | Admitting: Obstetrics and Gynecology

## 2020-07-20 ENCOUNTER — Other Ambulatory Visit: Payer: Self-pay | Admitting: Advanced Practice Midwife

## 2020-07-20 ENCOUNTER — Inpatient Hospital Stay (HOSPITAL_COMMUNITY)
Admission: AD | Admit: 2020-07-20 | Discharge: 2020-07-23 | DRG: 787 | Disposition: A | Discharge status: Home / Self Care | Payer: Medicaid Other | Attending: Obstetrics and Gynecology | Admitting: Obstetrics and Gynecology

## 2020-07-20 ENCOUNTER — Inpatient Hospital Stay (HOSPITAL_COMMUNITY): Payer: Medicaid Other

## 2020-07-20 ENCOUNTER — Encounter (HOSPITAL_COMMUNITY)
Admission: AD | Disposition: A | Discharge status: Home / Self Care | Payer: Self-pay | Source: Home / Self Care | Attending: Obstetrics and Gynecology

## 2020-07-20 DIAGNOSIS — O24119 Pre-existing diabetes mellitus, type 2, in pregnancy, unspecified trimester: Secondary | ICD-10-CM | POA: Diagnosis present

## 2020-07-20 DIAGNOSIS — Z7984 Long term (current) use of oral hypoglycemic drugs: Secondary | ICD-10-CM | POA: Diagnosis not present

## 2020-07-20 DIAGNOSIS — O9903 Anemia complicating the puerperium: Secondary | ICD-10-CM | POA: Diagnosis not present

## 2020-07-20 DIAGNOSIS — Z794 Long term (current) use of insulin: Secondary | ICD-10-CM | POA: Diagnosis not present

## 2020-07-20 DIAGNOSIS — O322XX0 Maternal care for transverse and oblique lie, not applicable or unspecified: Secondary | ICD-10-CM | POA: Diagnosis present

## 2020-07-20 DIAGNOSIS — O34211 Maternal care for low transverse scar from previous cesarean delivery: Secondary | ICD-10-CM | POA: Diagnosis present

## 2020-07-20 DIAGNOSIS — O2412 Pre-existing diabetes mellitus, type 2, in childbirth: Secondary | ICD-10-CM | POA: Diagnosis present

## 2020-07-20 DIAGNOSIS — Z98891 History of uterine scar from previous surgery: Secondary | ICD-10-CM

## 2020-07-20 DIAGNOSIS — D649 Anemia, unspecified: Secondary | ICD-10-CM

## 2020-07-20 DIAGNOSIS — O2413 Pre-existing diabetes mellitus, type 2, in the puerperium: Secondary | ICD-10-CM | POA: Diagnosis not present

## 2020-07-20 DIAGNOSIS — Z3A38 38 weeks gestation of pregnancy: Secondary | ICD-10-CM

## 2020-07-20 DIAGNOSIS — O24111 Pre-existing diabetes mellitus, type 2, in pregnancy, first trimester: Secondary | ICD-10-CM

## 2020-07-20 DIAGNOSIS — Z331 Pregnant state, incidental: Secondary | ICD-10-CM | POA: Insufficient documentation

## 2020-07-20 DIAGNOSIS — E119 Type 2 diabetes mellitus without complications: Secondary | ICD-10-CM | POA: Diagnosis present

## 2020-07-20 DIAGNOSIS — O9081 Anemia of the puerperium: Secondary | ICD-10-CM | POA: Diagnosis not present

## 2020-07-20 DIAGNOSIS — D62 Acute posthemorrhagic anemia: Secondary | ICD-10-CM | POA: Diagnosis not present

## 2020-07-20 DIAGNOSIS — O321XX0 Maternal care for breech presentation, not applicable or unspecified: Secondary | ICD-10-CM

## 2020-07-20 DIAGNOSIS — O328XX0 Maternal care for other malpresentation of fetus, not applicable or unspecified: Secondary | ICD-10-CM

## 2020-07-20 LAB — GLUCOSE, CAPILLARY
Glucose-Capillary: 136 mg/dL — ABNORMAL HIGH (ref 70–99)
Glucose-Capillary: 96 mg/dL (ref 70–99)
Glucose-Capillary: 100 mg/dL — ABNORMAL HIGH (ref 70–99)
Glucose-Capillary: 122 mg/dL — ABNORMAL HIGH (ref 70–99)

## 2020-07-20 LAB — CBC
HCT: 37.8 % (ref 36.0–46.0)
HCT: 27.8 % — ABNORMAL LOW (ref 36.0–46.0)
Hemoglobin: 12.7 g/dL (ref 12.0–15.0)
Hemoglobin: 9.2 g/dL — ABNORMAL LOW (ref 12.0–15.0)
MCH: 28.2 pg (ref 26.0–34.0)
MCH: 28.3 pg (ref 26.0–34.0)
MCHC: 33.6 g/dL (ref 30.0–36.0)
MCHC: 33.1 g/dL (ref 30.0–36.0)
MCV: 83.8 fL (ref 80.0–100.0)
MCV: 85.5 fL (ref 80.0–100.0)
Platelets: 297 10*3/uL (ref 150–400)
Platelets: 203 10*3/uL (ref 150–400)
RBC: 4.51 MIL/uL (ref 3.87–5.11)
RBC: 3.25 MIL/uL — ABNORMAL LOW (ref 3.87–5.11)
RDW: 13.6 % (ref 11.5–15.5)
RDW: 13.6 % (ref 11.5–15.5)
WBC: 7.8 10*3/uL (ref 4.0–10.5)
WBC: 10.5 10*3/uL (ref 4.0–10.5)
nRBC: 0 % (ref 0.0–0.2)
nRBC: 0 % (ref 0.0–0.2)

## 2020-07-20 LAB — CREATININE, SERUM
Creatinine, Ser: 0.44 mg/dL (ref 0.44–1.00)
GFR calc Af Amer: >60 mL/min (ref >60)
GFR calc non Af Amer: >60 mL/min (ref >60)

## 2020-07-20 LAB — COMPREHENSIVE METABOLIC PANEL
ALT: 11 U/L (ref 0–44)
AST: 19 U/L (ref 15–41)
Albumin: 3 g/dL — ABNORMAL LOW (ref 3.5–5.0)
Alkaline Phosphatase: 179 U/L — ABNORMAL HIGH (ref 38–126)
Anion gap: 11 (ref 5–15)
BUN: 11 mg/dL (ref 6–20)
CO2: 19 mmol/L — ABNORMAL LOW (ref 22–32)
Calcium: 9.3 mg/dL (ref 8.9–10.3)
Chloride: 104 mmol/L (ref 98–111)
Creatinine, Ser: 0.52 mg/dL (ref 0.44–1.00)
GFR calc Af Amer: >60 mL/min (ref >60)
GFR calc non Af Amer: >60 mL/min (ref >60)
Glucose, Bld: 144 mg/dL — ABNORMAL HIGH (ref 70–99)
Potassium: 4 mmol/L (ref 3.5–5.1)
Sodium: 134 mmol/L — ABNORMAL LOW (ref 135–145)
Total Bilirubin: 0.4 mg/dL (ref 0.3–1.2)
Total Protein: 6.9 g/dL (ref 6.5–8.1)

## 2020-07-20 LAB — TYPE AND SCREEN
ABO/RH(D): B POS
Antibody Screen: NEGATIVE

## 2020-07-20 LAB — RPR: RPR Ser Ql: NONREACTIVE

## 2020-07-20 SURGERY — Surgical Case
Anesthesia: Spinal

## 2020-07-20 MED ORDER — ONDANSETRON HCL 4 MG/2ML IJ SOLN: 4.0000 mg | Freq: Four times a day (QID) | INTRAMUSCULAR | Status: DC | PRN

## 2020-07-20 MED ORDER — DIPHENHYDRAMINE HCL 25 MG PO CAPS: 25.0000 mg | ORAL_CAPSULE | ORAL | Status: DC | PRN

## 2020-07-20 MED ORDER — FENTANYL CITRATE (PF) 100 MCG/2ML IJ SOLN: 50.0000 ug | INTRAMUSCULAR | Status: DC | PRN

## 2020-07-20 MED ORDER — MORPHINE SULFATE (PF) 0.5 MG/ML IJ SOLN
INTRAMUSCULAR | Status: AC
Start: 2020-07-20 — End: ?
  Filled 2020-07-20: qty 10

## 2020-07-20 MED ORDER — NALOXONE HCL 4 MG/10ML IJ SOLN
1.0000 ug/kg/h | INTRAVENOUS | Status: DC | PRN
  Filled 2020-07-20: qty 5

## 2020-07-20 MED ORDER — DIPHENHYDRAMINE HCL 50 MG/ML IJ SOLN: 12.5000 mg | INTRAMUSCULAR | Status: DC | PRN

## 2020-07-20 MED ORDER — NALBUPHINE HCL 10 MG/ML IJ SOLN: 5.0000 mg | Freq: Once | INTRAMUSCULAR | Status: DC | PRN

## 2020-07-20 MED ORDER — PHENYLEPHRINE HCL (PRESSORS) 10 MG/ML IV SOLN
INTRAVENOUS | Status: DC | PRN
  Administered 2020-07-20: 120 ug via INTRAVENOUS

## 2020-07-20 MED ORDER — NALBUPHINE HCL 10 MG/ML IJ SOLN: 5.0000 mg | INTRAMUSCULAR | Status: DC | PRN

## 2020-07-20 MED ORDER — ACETAMINOPHEN 500 MG PO TABS: 1000.0000 mg | ORAL_TABLET | Freq: Four times a day (QID) | ORAL | Status: DC

## 2020-07-20 MED ORDER — ACETAMINOPHEN 325 MG PO TABS: 650.0000 mg | ORAL_TABLET | ORAL | Status: DC | PRN

## 2020-07-20 MED ORDER — TRANEXAMIC ACID-NACL 1000-0.7 MG/100ML-% IV SOLN
INTRAVENOUS | Status: DC | PRN
  Administered 2020-07-20: 1000 mg via INTRAVENOUS

## 2020-07-20 MED ORDER — CEFAZOLIN SODIUM-DEXTROSE 2-4 GM/100ML-% IV SOLN
INTRAVENOUS | Status: AC
  Filled 2020-07-20: qty 100

## 2020-07-20 MED ORDER — INSULIN ASPART 100 UNIT/ML ~~LOC~~ SOLN
0.0000 [IU] | Freq: Three times a day (TID) | SUBCUTANEOUS | Status: DC
  Administered 2020-07-21 – 2020-07-22 (×2): 2 [IU] via SUBCUTANEOUS

## 2020-07-20 MED ORDER — PHENYLEPHRINE HCL-NACL 20-0.9 MG/250ML-% IV SOLN
INTRAVENOUS | Status: DC | PRN
  Administered 2020-07-20: 60 ug/min via INTRAVENOUS

## 2020-07-20 MED ORDER — SIMETHICONE 80 MG PO CHEW
80.0000 mg | CHEWABLE_TABLET | Freq: Three times a day (TID) | ORAL | Status: DC
  Administered 2020-07-21 – 2020-07-23 (×6): 80 mg via ORAL
  Filled 2020-07-20 (×7): qty 1

## 2020-07-20 MED ORDER — OXYTOCIN-SODIUM CHLORIDE 30-0.9 UT/500ML-% IV SOLN
INTRAVENOUS | Status: DC | PRN
  Administered 2020-07-20: 30 [IU] via INTRAVENOUS

## 2020-07-20 MED ORDER — CEFAZOLIN SODIUM-DEXTROSE 2-4 GM/100ML-% IV SOLN
2.0000 g | INTRAVENOUS | Status: AC
  Administered 2020-07-20: 2 g via INTRAVENOUS

## 2020-07-20 MED ORDER — OXYCODONE HCL 5 MG PO TABS
5.0000 mg | ORAL_TABLET | ORAL | Status: DC | PRN
  Administered 2020-07-21: 5 mg via ORAL
  Filled 2020-07-20: qty 1

## 2020-07-20 MED ORDER — NALOXONE HCL 0.4 MG/ML IJ SOLN: 0.4000 mg | INTRAMUSCULAR | Status: DC | PRN

## 2020-07-20 MED ORDER — MORPHINE SULFATE (PF) 0.5 MG/ML IJ SOLN
INTRAMUSCULAR | Status: DC | PRN
Start: 2020-07-20 — End: 2020-07-20
  Administered 2020-07-20: 150 ug via INTRATHECAL

## 2020-07-20 MED ORDER — BUPIVACAINE IN DEXTROSE 0.75-8.25 % IT SOLN
INTRATHECAL | Status: DC | PRN
  Administered 2020-07-20: 1.6 mL via INTRATHECAL

## 2020-07-20 MED ORDER — COCONUT OIL OIL: 1.0000 "application " | TOPICAL_OIL | Status: DC | PRN

## 2020-07-20 MED ORDER — SOD CITRATE-CITRIC ACID 500-334 MG/5ML PO SOLN
30.0000 mL | ORAL | Status: DC | PRN
  Administered 2020-07-20: 30 mL via ORAL
  Filled 2020-07-20: qty 30

## 2020-07-20 MED ORDER — KETOROLAC TROMETHAMINE 30 MG/ML IJ SOLN
INTRAMUSCULAR | Status: AC
  Filled 2020-07-20: qty 1

## 2020-07-20 MED ORDER — ENOXAPARIN SODIUM 40 MG/0.4ML ~~LOC~~ SOLN
40.0000 mg | SUBCUTANEOUS | Status: DC
  Administered 2020-07-21 – 2020-07-23 (×3): 40 mg via SUBCUTANEOUS
  Filled 2020-07-20 (×3): qty 0.4

## 2020-07-20 MED ORDER — ONDANSETRON HCL 4 MG/2ML IJ SOLN
INTRAMUSCULAR | Status: AC
  Filled 2020-07-20: qty 2

## 2020-07-20 MED ORDER — PRENATAL MULTIVITAMIN CH
1.0000 | ORAL_TABLET | Freq: Every day | ORAL | Status: DC
  Administered 2020-07-21 – 2020-07-23 (×3): 1 via ORAL
  Filled 2020-07-20 (×3): qty 1

## 2020-07-20 MED ORDER — LACTATED RINGERS IV SOLN: 500.0000 mL | INTRAVENOUS | Status: DC | PRN

## 2020-07-20 MED ORDER — SODIUM CHLORIDE 0.9% FLUSH: 3.0000 mL | INTRAVENOUS | Status: DC | PRN

## 2020-07-20 MED ORDER — SIMETHICONE 80 MG PO CHEW: 80.0000 mg | CHEWABLE_TABLET | ORAL | Status: DC | PRN

## 2020-07-20 MED ORDER — SODIUM CHLORIDE 0.9 % IR SOLN
Status: DC | PRN
  Administered 2020-07-20: 1000 mL

## 2020-07-20 MED ORDER — ZOLPIDEM TARTRATE 5 MG PO TABS: 5.0000 mg | ORAL_TABLET | Freq: Every evening | ORAL | Status: DC | PRN

## 2020-07-20 MED ORDER — OXYTOCIN-SODIUM CHLORIDE 30-0.9 UT/500ML-% IV SOLN: 2.5000 [IU]/h | INTRAVENOUS | Status: AC

## 2020-07-20 MED ORDER — OXYTOCIN-SODIUM CHLORIDE 30-0.9 UT/500ML-% IV SOLN
INTRAVENOUS | Status: AC
  Filled 2020-07-20: qty 500

## 2020-07-20 MED ORDER — OXYCODONE-ACETAMINOPHEN 5-325 MG PO TABS: 2.0000 | ORAL_TABLET | ORAL | Status: DC | PRN

## 2020-07-20 MED ORDER — SIMETHICONE 80 MG PO CHEW
80.0000 mg | CHEWABLE_TABLET | ORAL | Status: DC
  Administered 2020-07-21 – 2020-07-22 (×3): 80 mg via ORAL
  Filled 2020-07-20 (×2): qty 1

## 2020-07-20 MED ORDER — ALBUMIN HUMAN 5 % IV SOLN: INTRAVENOUS | Status: DC | PRN

## 2020-07-20 MED ORDER — ONDANSETRON HCL 4 MG/2ML IJ SOLN
INTRAMUSCULAR | Status: DC | PRN
  Administered 2020-07-20: 4 mg via INTRAVENOUS

## 2020-07-20 MED ORDER — IBUPROFEN 800 MG PO TABS
800.0000 mg | ORAL_TABLET | Freq: Three times a day (TID) | ORAL | Status: DC
  Administered 2020-07-21 – 2020-07-23 (×8): 800 mg via ORAL
  Filled 2020-07-20 (×8): qty 1

## 2020-07-20 MED ORDER — PHENYLEPHRINE HCL-NACL 20-0.9 MG/250ML-% IV SOLN
INTRAVENOUS | Status: AC
  Filled 2020-07-20: qty 250

## 2020-07-20 MED ORDER — MEPERIDINE HCL 25 MG/ML IJ SOLN: 6.2500 mg | INTRAMUSCULAR | Status: DC | PRN

## 2020-07-20 MED ORDER — LACTATED RINGERS IV SOLN: INTRAVENOUS | Status: DC

## 2020-07-20 MED ORDER — FENTANYL CITRATE (PF) 100 MCG/2ML IJ SOLN
INTRAMUSCULAR | Status: AC
  Filled 2020-07-20: qty 2

## 2020-07-20 MED ORDER — DIPHENHYDRAMINE HCL 25 MG PO CAPS: 25.0000 mg | ORAL_CAPSULE | Freq: Four times a day (QID) | ORAL | Status: DC | PRN

## 2020-07-20 MED ORDER — OXYTOCIN BOLUS FROM INFUSION: 333.0000 mL | Freq: Once | INTRAVENOUS | Status: DC

## 2020-07-20 MED ORDER — TETANUS-DIPHTH-ACELL PERTUSSIS 5-2.5-18.5 LF-MCG/0.5 IM SUSP: 0.5000 mL | Freq: Once | INTRAMUSCULAR | Status: DC

## 2020-07-20 MED ORDER — ONDANSETRON HCL 4 MG/2ML IJ SOLN
4.0000 mg | Freq: Three times a day (TID) | INTRAMUSCULAR | Status: DC | PRN
  Administered 2020-07-20: 4 mg via INTRAVENOUS

## 2020-07-20 MED ORDER — STERILE WATER FOR IRRIGATION IR SOLN
Status: DC | PRN
  Administered 2020-07-20: 1000 mL

## 2020-07-20 MED ORDER — SCOPOLAMINE 1 MG/3DAYS TD PT72
1.0000 | MEDICATED_PATCH | Freq: Once | TRANSDERMAL | Status: AC
  Administered 2020-07-20: 1.5 mg via TRANSDERMAL

## 2020-07-20 MED ORDER — LIDOCAINE HCL (PF) 1 % IJ SOLN: 30.0000 mL | INTRAMUSCULAR | Status: DC | PRN

## 2020-07-20 MED ORDER — OXYCODONE-ACETAMINOPHEN 5-325 MG PO TABS: 1.0000 | ORAL_TABLET | ORAL | Status: DC | PRN

## 2020-07-20 MED ORDER — PROMETHAZINE HCL 25 MG/ML IJ SOLN: 6.2500 mg | INTRAMUSCULAR | Status: DC | PRN

## 2020-07-20 MED ORDER — WITCH HAZEL-GLYCERIN EX PADS: 1.0000 "application " | MEDICATED_PAD | CUTANEOUS | Status: DC | PRN

## 2020-07-20 MED ORDER — PHENYLEPHRINE 40 MCG/ML (10ML) SYRINGE FOR IV PUSH (FOR BLOOD PRESSURE SUPPORT)
PREFILLED_SYRINGE | INTRAVENOUS | Status: AC
  Filled 2020-07-20: qty 10

## 2020-07-20 MED ORDER — KETOROLAC TROMETHAMINE 30 MG/ML IJ SOLN
30.0000 mg | Freq: Four times a day (QID) | INTRAMUSCULAR | Status: AC | PRN
  Administered 2020-07-20 – 2020-07-21 (×2): 30 mg via INTRAVENOUS
  Filled 2020-07-20: qty 1

## 2020-07-20 MED ORDER — GABAPENTIN 300 MG PO CAPS
300.0000 mg | ORAL_CAPSULE | Freq: Two times a day (BID) | ORAL | Status: DC
  Administered 2020-07-20 – 2020-07-23 (×6): 300 mg via ORAL
  Filled 2020-07-20 (×2): qty 3
  Filled 2020-07-20 (×5): qty 1
  Filled 2020-07-20: qty 3
  Filled 2020-07-20: qty 1
  Filled 2020-07-20: qty 3
  Filled 2020-07-20: qty 1

## 2020-07-20 MED ORDER — MENTHOL 3 MG MT LOZG: 1.0000 | LOZENGE | OROMUCOSAL | Status: DC | PRN

## 2020-07-20 MED ORDER — DIBUCAINE (PERIANAL) 1 % EX OINT: 1.0000 "application " | TOPICAL_OINTMENT | CUTANEOUS | Status: DC | PRN

## 2020-07-20 MED ORDER — TRANEXAMIC ACID-NACL 1000-0.7 MG/100ML-% IV SOLN
INTRAVENOUS | Status: AC
  Filled 2020-07-20: qty 100

## 2020-07-20 MED ORDER — SCOPOLAMINE 1 MG/3DAYS TD PT72
MEDICATED_PATCH | TRANSDERMAL | Status: AC
  Filled 2020-07-20: qty 1

## 2020-07-20 MED ORDER — ALBUMIN HUMAN 5 % IV SOLN
INTRAVENOUS | Status: AC
  Filled 2020-07-20: qty 250

## 2020-07-20 MED ORDER — LACTATED RINGERS IV SOLN: INTRAVENOUS | Status: DC | PRN

## 2020-07-20 MED ORDER — FENTANYL CITRATE (PF) 100 MCG/2ML IJ SOLN: 25.0000 ug | INTRAMUSCULAR | Status: DC | PRN

## 2020-07-20 MED ORDER — FENTANYL CITRATE (PF) 100 MCG/2ML IJ SOLN
INTRAMUSCULAR | Status: DC | PRN
Start: 2020-07-20 — End: 2020-07-20
  Administered 2020-07-20: 15 ug via INTRATHECAL

## 2020-07-20 MED ORDER — KETOROLAC TROMETHAMINE 30 MG/ML IJ SOLN: 30.0000 mg | Freq: Four times a day (QID) | INTRAMUSCULAR | Status: AC | PRN

## 2020-07-20 MED ORDER — OXYTOCIN-SODIUM CHLORIDE 30-0.9 UT/500ML-% IV SOLN: 2.5000 [IU]/h | INTRAVENOUS | Status: DC

## 2020-07-20 MED ORDER — ACETAMINOPHEN 500 MG PO TABS
1000.0000 mg | ORAL_TABLET | Freq: Three times a day (TID) | ORAL | Status: DC
  Administered 2020-07-20 – 2020-07-23 (×8): 1000 mg via ORAL
  Filled 2020-07-20 (×8): qty 2

## 2020-07-20 MED ORDER — SENNOSIDES-DOCUSATE SODIUM 8.6-50 MG PO TABS
2.0000 | ORAL_TABLET | ORAL | Status: DC
  Administered 2020-07-21 – 2020-07-22 (×3): 2 via ORAL
  Filled 2020-07-20 (×4): qty 2

## 2020-07-20 SURGICAL SUPPLY — 34 items
APL SKNCLS STERI-STRIP NONHPOA (GAUZE/BANDAGES/DRESSINGS) ×1
BENZOIN TINCTURE PRP APPL 2/3 (GAUZE/BANDAGES/DRESSINGS) ×1 IMPLANT
CHLORAPREP W/TINT 26ML (MISCELLANEOUS) ×2 IMPLANT
CLAMP CORD UMBIL (MISCELLANEOUS) IMPLANT
CLOTH BEACON ORANGE TIMEOUT ST (SAFETY) ×2 IMPLANT
DRSG OPSITE POSTOP 4X10 (GAUZE/BANDAGES/DRESSINGS) ×2 IMPLANT
ELECT REM PT RETURN 9FT ADLT (ELECTROSURGICAL) ×2
ELECTRODE REM PT RTRN 9FT ADLT (ELECTROSURGICAL) ×1 IMPLANT
EXTRACTOR VACUUM BELL STYLE (SUCTIONS) IMPLANT
GLOVE BIOGEL PI IND STRL 6.5 (GLOVE) ×1 IMPLANT
GLOVE BIOGEL PI IND STRL 7.0 (GLOVE) ×2 IMPLANT
GLOVE BIOGEL PI INDICATOR 6.5 (GLOVE) ×1
GLOVE BIOGEL PI INDICATOR 7.0 (GLOVE) ×2
GLOVE ORTHOPEDIC STR SZ6.5 (GLOVE) ×2 IMPLANT
GOWN STRL REUS W/TWL LRG LVL3 (GOWN DISPOSABLE) ×6 IMPLANT
KIT ABG SYR 3ML LUER SLIP (SYRINGE) IMPLANT
NDL HYPO 25X1 1.5 SAFETY (NEEDLE) IMPLANT
NEEDLE HYPO 22GX1.5 SAFETY (NEEDLE) ×2 IMPLANT
NEEDLE HYPO 25X1 1.5 SAFETY (NEEDLE) IMPLANT
NS IRRIG 1000ML POUR BTL (IV SOLUTION) ×2 IMPLANT
PACK C SECTION WH (CUSTOM PROCEDURE TRAY) ×2 IMPLANT
PAD OB MATERNITY 4.3X12.25 (PERSONAL CARE ITEMS) ×2 IMPLANT
PENCIL SMOKE EVAC W/HOLSTER (ELECTROSURGICAL) ×2 IMPLANT
STRIP CLOSURE SKIN 1/2X4 (GAUZE/BANDAGES/DRESSINGS) ×1 IMPLANT
SUT MON AB 4-0 PS1 27 (SUTURE) ×2 IMPLANT
SUT PLAIN 2 0 (SUTURE) ×2
SUT PLAIN ABS 2-0 CT1 27XMFL (SUTURE) ×1 IMPLANT
SUT VIC AB 0 CT1 36 (SUTURE) ×4 IMPLANT
SUT VIC AB 0 CTX 36 (SUTURE) ×2
SUT VIC AB 0 CTX36XBRD ANBCTRL (SUTURE) ×1 IMPLANT
SYR CONTROL 10ML LL (SYRINGE) ×2 IMPLANT
TOWEL OR 17X24 6PK STRL BLUE (TOWEL DISPOSABLE) ×2 IMPLANT
TRAY FOLEY W/BAG SLVR 14FR LF (SET/KITS/TRAYS/PACK) ×2 IMPLANT
WATER STERILE IRR 1000ML POUR (IV SOLUTION) ×2 IMPLANT

## 2020-07-20 NOTE — Op Note (Signed)
Angelica [name] PROCEDURE DATE: 07/20/2020  PREOPERATIVE DIAGNOSES: Intrauterine pregnancy at [redacted]w[redacted]d weeks gestation; malpresentation: oblique, s/p failed ECV and poorly controlled Type 2 DM  POSTOPERATIVE DIAGNOSES: The same  PROCEDURE: Repeat Low Transverse Cesarean Section with J extension   SURGEON:  Baldemar Lenis, MD  ASSISTANT:  Lynnda Shields, MD  An experienced assistant was required given the standard of surgical care given the complexity of the case.  This assistant was needed for exposure, dissection, suctioning, retraction, instrument exchange, assisting with delivery with administration of fundal pressure, and for overall help during the procedure  ANESTHESIOLOGY TEAM: Anesthesiologist: Mellody Dance, MD CRNA: Renford Dills, CRNA  INDICATIONS: Angelica [name] is a 34 y.o. [phone] at [redacted]w[redacted]d here for cesarean section secondary to the indications listed under preoperative diagnoses; please see preoperative note for further details.  The risks of cesarean section were discussed with the patient including but were not limited to: bleeding which may require transfusion or reoperation; infection which may require antibiotics; injury to bowel, bladder, ureters or other surrounding organs; injury to the fetus; need for additional procedures including hysterectomy in the event of a life-threatening hemorrhage; placental abnormalities wth subsequent pregnancies, incisional problems, thromboembolic phenomenon and other postoperative/anesthesia complications.  She consents to blood transfusion in the event of an emergency. The patient verbalized understanding of the plan, giving informed written consent for the procedure.    FINDINGS:  Viable female infant in oblique presentation.  Weight and Apgars pending. Clear amniotic fluid.  Intact placenta, three vessel cord.  Normal uterus, fallopian tubes and ovaries bilaterally. Extension of the uterine incision in a J fashion up the left side of  the uterus  ANESTHESIA: Spinal INTRAVENOUS FLUIDS: 2000 ml   ESTIMATED BLOOD LOSS: 1517 ml URINE OUTPUT:  100 ml SPECIMENS: Placenta sent to L&D COMPLICATIONS: None immediate  PROCEDURE IN DETAIL:  The patient preoperatively received intravenous antibiotics and had sequential compression devices applied to her lower extremities.  She was then taken to the operating room where spinal anesthesia was administered and was found to be adequate. She was then placed in a dorsal supine position with a leftward tilt. The was prepped and draped in a sterile manner.  A foley catheter was placed into her bladder with sterile technique and attached to constant gravity.  After a timeout was performed, a Pfannenstiel skin incision was made with scalpel over her preexisting scar and carried through to the underlying layer of fascia. The fascia was incised in the midline, and this incision was extended bilaterally using the Mayo scissors.  Kocher clamps were applied to the superior aspect of the fascial incision and the underlying rectus muscles were dissected off sharply.  A similar process was carried out on the inferior aspect of the fascial incision. The rectus muscles were separated in the midline sharply and the peritoneum was entered bluntly. The peritoneal incision was carefully extended bluntly laterally and caudad with good visualization of the bladder. The uterus appeared normal. The Alexis O-ring retractor was placed into the incision, taking care not to incorporate bowel or omentum. The bladder blade was inserted. Attention was turned to the lower uterine segment where a bladder flap was made and the bladder serosa pushed gently inferiorly. Then a low transverse hysterotomy was made with a scalpel and extended bilaterally bluntly.  The infant was in a transverse position and after a minute, the buttocks were grasped and brought inferiorly. The hips were delivered easily, with the trunk following atraumatically.  The shoulders/arm  were delivered by gently rotating the infant side to side and the head delivered easily. The infant was brisk from delivery, the nose and mouth were bulb suctioned, and the cord clamped and cut after 1 minute. The infant was then handed over to the waiting neonatology team. Uterine massage was then performed, and the placenta delivered intact with a three-vessel cord. The uterus was then gently exteriorized and cleared of clots and debris.  Most of hemorrhage came from bleeding at the hysterotomy by several large vessels and tranexamic acid given at this point. The hysterotomy was closed with 0 Vicryl in a running locked fashion, and an imbricating layer was also placed with 0 Vicryl. Figure-of-eight 0 Vicryl serosal stitches were placed to help with hemostasis.  The fallopian tubes and ovaries were visualized bilaterally and normal appearing. The uterus was then gently replaced within the abdomen. The Alexis retractor was removed.  The pelvis was cleared of all clot and debris. Arista was placed over the hysterotomy for additional hemostasis.  Hemostasis was again confirmed on all surfaces. The fascia was then closed using 0 PDS in a running fashion.  The subcutaneous layer was irrigated, then reapproximated with 2-0 plain gut.  The skin was closed with a 4-0 Monocryl subcuticular stitch and a Pravena dressing was placed. The patient tolerated the procedure well. Sponge, lap, instrument and needle counts were correct x 3.  She was taken to the recovery room in stable condition.    Baldemar Lenis, M.D. Attending Obstetrician & Gynecologist, [facility] for [employer], [facility]

## 2020-07-20 NOTE — Discharge Summary (Signed)
Postpartum Discharge Summary  Patient Name: Angelica [name] DOB: Jun 30, 1986 MRN: [id number]  Date of admission: 07/20/2020 Delivery date:07/20/2020  Delivering provider: Sloan Leiter  Date of discharge: 07/23/2020  Admitting diagnosis: IUP (intrauterine pregnancy), incidental [Z33.1] Intrauterine pregnancy: [redacted]w[redacted]d    Secondary diagnosis:  Principal Problem:   Cesarean delivery delivered Active Problems:   History of VBAC   Pre-existing type 2 diabetes mellitus during pregnancy   Acute blood loss anemia  Additional problems: rLTCS 2/2 failed ECV    Discharge diagnosis: Term Pregnancy Delivered, Type 2 DM and PPH                                              Post partum procedures:Iron infusion Augmentation: N/A Complications: [id number] Hospital course: Onset of Labor With Unplanned C/S   34y.o. yo [id number] admitted in Latent Labor on 07/20/2020. Patient had a labor course significant for breech presentation, failed ECV, PPH. The patient went for cesarean section due to Malpresentation. Delivery details as follows: Membrane Rupture Time/Date: 4:10 PM ,07/20/2020   Delivery Method:C-Section, Low Transverse  Details of operation can be found in separate operative note. Postpartum course complicated by PPH (EBL [id number]/p TXA. She is ambulating,tolerating a regular diet, passing flatus, and urinating well.  Patient is discharged home in stable condition 07/23/20. Given history of Type 2 Diabetes, pt was discharged on MTF 50062mID with instructions to establish care with a PCP.  Newborn Data: Birth date:07/20/2020  Birth time:4:11 PM  Gender:Female  Living status:Living  Apgars:7 ,9  [id number]  Magnesium Sulfate received: No BMZ received: No Rhophylac:No MMR:No T-DaP:Given prenatally Flu: No Transfusion:No  Physical exam  Vitals:   07/22/20 0516 07/22/20 1400 07/22/20 2200 07/23/20 0512  BP: (!) 91/57 100/63 (!) 99/53 (!) 97/59  Pulse: 64 63 62 63   Resp: 16 16 16 18   Temp: 98.1 F (36.7 C) 98 F (36.7 C) 98.3 F (36.8 C) 98.6 F (37 C)  TempSrc: Oral Oral Oral Oral  SpO2: 97%  99% 100%  Weight:      Height:       General: alert, cooperative and no distress Lochia: appropriate Uterine Fundus: firm Incision: No significant erythema, Dressing is clean, dry, and intact, Wound provena in place DVT Evaluation: No evidence of DVT seen on physical exam. No cords or calf tenderness. Labs: Lab Results  Component Value Date   WBC 9.0 07/21/2020   HGB 7.7 (L) 07/21/2020   HCT 23.5 (L) 07/21/2020   MCV 83.9 07/21/2020   PLT 182 07/21/2020   CMP Latest Ref Rng & Units 07/20/2020  Glucose 70 - 99 mg/dL -  BUN 6 - 20 mg/dL -  Creatinine 0.44 - 1.00 mg/dL 0.44  Sodium 135 - 145 mmol/L -  Potassium 3.5 - 5.1 mmol/L -  Chloride 98 - 111 mmol/L -  CO2 22 - 32 mmol/L -  Calcium 8.9 - 10.3 mg/dL -  Total Protein 6.5 - 8.1 g/dL -  Total Bilirubin 0.3 - 1.2 mg/dL -  Alkaline Phos 38 - 126 U/L -  AST 15 - 41 U/L -  ALT 0 - 44 U/L -   Edinburgh Score: Edinburgh Postnatal Depression Scale Screening Tool 07/20/2020  I have been able to laugh and see the funny side of things. 0  I have looked forward with enjoyment  to things. 0  I have blamed myself unnecessarily when things went wrong. 0  I have been anxious or worried for no good reason. 0  I have felt scared or panicky for no good reason. 0  Things have been getting on top of me. 0  I have been so unhappy that I have had difficulty sleeping. 0  I have felt sad or miserable. 0  I have been so unhappy that I have been crying. 0  The thought of harming myself has occurred to me. 0  Edinburgh Postnatal Depression Scale Total 0     After visit meds:  Allergies as of 07/23/2020   No Known Allergies     Medication List    STOP taking these medications   aspirin EC 81 MG tablet   glucose blood test strip Commonly known as: True Metrix Blood Glucose Test   insulin NPH Human 100  UNIT/ML injection Commonly known as: NOVOLIN N   INSULIN SYRINGE .5CC/31GX5/16" 31G X 5/16" 0.5 ML Misc   True Metrix Meter w/Device Kit   TRUEplus Lancets 28G Misc     TAKE these medications   acetaminophen 500 MG tablet Commonly known as: TYLENOL Take 2 tablets (1,000 mg total) by mouth every 6 (six) hours as needed for mild pain.   coconut oil Oil Apply 1 application topically as needed.   ferrous sulfate 325 (65 FE) MG tablet Take 1 tablet (325 mg total) by mouth every other day. Start taking on: July 24, 2020   ibuprofen 800 MG tablet Commonly known as: ADVIL Take 1 tablet (800 mg total) by mouth every 8 (eight) hours.   metFORMIN 500 MG tablet Commonly known as: GLUCOPHAGE Take 1 tablet (500 mg total) by mouth 2 (two) times daily with a meal.   Prenatal Vitamins 28-0.8 MG Tabs Take 1 tablet by mouth daily.            Discharge Care Instructions  (From admission, onward)         Start     Ordered   07/23/20 0000  Leave dressing on - Keep it clean, dry, and intact until clinic visit        07/23/20 0922           Discharge home in stable condition Infant Feeding: Breast Infant Disposition:home with mother Discharge instruction: per After Visit Summary and Postpartum booklet. Activity: Advance as tolerated. Pelvic rest for 6 weeks.  Diet: routine diet Future Appointments: No future appointments. Follow up Visit:  [place] [facility] [place] [facility] Follow up in 1 week(s).   Why: for incision check & 4 weeks for postpartum appointment (you will receive a call regarding your appointment dates and times) Contact information: [place], [place] [address]  Phone: [phone]              Please schedule this patient for a In person postpartum visit in 4 weeks with the following provider: Any provider. Additional Postpartum F/U: repeat A1c (may need up-titration of MTF or addition of  insulin), incision check 1 week High risk pregnancy complicated by: PPH, T2IZ Delivery mode:  C-Section, Low Transverse  Anticipated Birth Control:  Depo  Randa Ngo, MD OB Fellow, Faculty [facility] 07/23/2020 9:32 AM

## 2020-07-20 NOTE — Progress Notes (Signed)
Faculty Note  S: In to meet patient. She is feeling well, occasional contraction. Active fetal movement.  O: BP 114/73   Pulse 85   Temp 98.1 F (36.7 C)   LMP 10/28/2019   Gen: alert, oriented Bedside US: oblique, fetal head to maternal right  FHT: 150 bpm, moderate variability, accels present, no decels Toco: no ctx   A/P: Pt is 34 y.o. [id number] @ [redacted]w[redacted]d who is admitted for induction of labor with TOLAC for poorly controlled T2DM. On arrival, patient noted to be breech. On bedside US, she is oblique with active fetal movement head to maternal right. Counseled patient regarding options.Reviewed risks of external cephalic version including risk of pain, bleeding, abruption, fetal intolerance, failure for turn fetus. Reviewed risk of need for urgent c-section for fetal intolerance, including risks of c-section of infection, bleeding, damage to surrounding tissue and organs. Reviewed that if she remains breech, would proceed with c-section. She verbalizes understanding and desires to proceed, consent signed.  Will plan for ECV this afternoon (NPO status)   Interview/consents done via Spanish interpretor   Baldemar Lenis, M.D. Attending Center for [employer] Midwife)

## 2020-07-20 NOTE — Anesthesia Procedure Notes (Signed)
Spinal  Patient location during procedure: OR Start time: 07/20/2020 3:35 PM End time: 07/20/2020 3:40 PM Staffing Performed: anesthesiologist  Anesthesiologist: Mellody Dance, MD Preanesthetic Checklist Completed: patient identified, IV checked, risks and benefits discussed, surgical consent, monitors and equipment checked, pre-op evaluation and timeout performed Spinal Block Patient position: sitting Prep: DuraPrep and site prepped and draped Patient monitoring: continuous pulse ox, blood pressure and heart rate Approach: midline Location: L3-4 Injection technique: single-shot Needle Needle type: Pencan  Needle gauge: 24 G Needle length: 9 cm Additional Notes Functioning IV was confirmed and monitors were applied. Sterile prep and drape, including hand hygiene and sterile gloves were used. The patient was positioned and the spine was prepped. The skin was anesthetized with lidocaine.  Free flow of clear CSF was obtained prior to injecting local anesthetic into the CSF. The needle was carefully withdrawn. The patient tolerated the procedure well.

## 2020-07-20 NOTE — Anesthesia Preprocedure Evaluation (Signed)
Anesthesia Evaluation  Patient identified by MRN, date of birth, ID band  Reviewed: Allergy & Precautions, NPO status , Patient's Chart, lab work & pertinent test results  Airway Mallampati: II  TM Distance: >3 FB Neck ROM: Full    Dental no notable dental hx.    Pulmonary neg pulmonary ROS,    Pulmonary exam normal breath sounds clear to auscultation       Cardiovascular Exercise Tolerance: Good negative cardio ROS Normal cardiovascular exam Rhythm:Regular Rate:Normal     Neuro/Psych negative neurological ROS  negative psych ROS   GI/Hepatic negative GI ROS, Neg liver ROS,   Endo/Other  diabetes, Poorly Controlled, Insulin Dependent  Renal/GU negative Renal ROS  negative genitourinary   Musculoskeletal negative musculoskeletal ROS (+)   Abdominal   Peds  Hematology negative hematology ROS (+)   Anesthesia Other Findings   Reproductive/Obstetrics (+) Pregnancy                             Anesthesia Physical Anesthesia Plan  ASA: II  Anesthesia Plan: Spinal   Post-op Pain Management:    Induction:   PONV Risk Score and Plan: 2  Airway Management Planned:   Additional Equipment:   Intra-op Plan:   Post-operative Plan:   Informed Consent: I have reviewed the patients History and Physical, chart, labs and discussed the procedure including the risks, benefits and alternatives for the proposed anesthesia with the patient or authorized representative who has indicated his/her understanding and acceptance.     Interpreter used for [employer] Discussed with: CRNA, Anesthesiologist and Surgeon  Anesthesia Plan Comments:         Anesthesia Quick Evaluation

## 2020-07-20 NOTE — Transfer of Care (Signed)
Immediate Anesthesia Transfer of Care Note  Patient: Angelica [name]  Procedure(s) Performed: CESAREAN SECTION (N/A )  Patient Location: PACU  Anesthesia Type:Spinal  Level of Consciousness: awake  Airway & Oxygen Therapy: Patient Spontanous Breathing  Post-op Assessment: Report given to RN and Post -op Vital signs reviewed and stable  Post vital signs: Reviewed and stable  Last Vitals:  Vitals Value Taken Time  BP 102/47 07/20/20 1700  Temp    Pulse 77 07/20/20 1705  Resp 15 07/20/20 1705  SpO2 97 % 07/20/20 1705  Vitals shown include unvalidated device data.  Last Pain:  Vitals:   07/20/20 1700  TempSrc: (P) Oral  PainSc: (P) 0-No pain         Complications: No complications documented.

## 2020-07-20 NOTE — H&P (Addendum)
OBSTETRIC ADMISSION HISTORY AND PHYSICAL  Angelica [name] is a 34 y.o. female [phone] with IUP at 15w0dby LMP presenting for IOL for T2DM, TOLAC (s/p successful VBAC x5). She reports +FMs, No LOF, no VB, no blurry vision, headaches or peripheral edema, and RUQ pain.  She plans on breast and bottle feeding. She request depo for birth control. Pt reports good adherence with BG checks QID and NPH insulin 5u qHS and 8u in AM with last dose in evening on 9/6. Pt reports most fasting BG levels in 90s.  [facility] Dating: By LMP --->  Estimated Date of Delivery: 08/03/20  Sono: _0 , CWD, normal anatomy, cephalic presentation, fundal placenta, 3626g, 90% EFW  Prenatal History/Complications:  Type 2 Diabetes TOLAC (CS > successful VBAC x5)  Past Medical History: Past Medical History:  Diagnosis Date  . Abscess 01/14/2019   gluteal   . Abscess and cellulitis of gluteal region 01/14/2019  . DM (diabetes mellitus), type 2 ([place]     Past Surgical History: Past Surgical History:  Procedure Laterality Date  . CESAREAN SECTION    . CHOLECYSTECTOMY      Obstetrical History: OB History     Gravida  8   Para  7   Term  7   Preterm      AB      Living  7      SAB      TAB      Ectopic      Multiple      Live Births  7           Social History Social History   Socioeconomic History  . Marital status: Married    Spouse name: Not on [name]  . Number of children: Not on [name]  . Years of education: Not on [name]  . Highest education level: Not on [name]  Occupational History  . Not on [name]  Tobacco Use  . Smoking status: Never Smoker  . Smokeless tobacco: Never Used  Vaping Use  . Vaping Use: Never used  Substance and Sexual Activity  . Alcohol use: Never  . Drug use: Never  . Sexual activity: Yes    Comment: withdrawal  Other Topics Concern  . Not on [name]  Social History Narrative  . Not on [name]   Social Determinants of  Health   Financial Resource Strain:   . Difficulty of Paying Living Expenses: Not on [name]  Food Insecurity: Food Insecurity Present  . Worried About RCharity fundraiserin the Last Year: Sometimes true  . Ran Out of Food in the Last Year: Sometimes true  Transportation Needs: Unmet Transportation Needs  . Lack of Transportation (Medical): Yes  . Lack of Transportation (Non-Medical): Yes  Physical Activity:   . Days of Exercise per Week: Not on [name]  . Minutes of Exercise per Session: Not on [name]  Stress:   . Feeling of Stress : Not on [name]  Social Connections:   . Frequency of Communication with Friends and Family: Not on [name]  . Frequency of Social Gatherings with Friends and Family: Not on [name]  . Attends Religious Services: Not on [name]  . Active Member of Clubs or Organizations: Not on [name]  . Attends CArchivistMeetings: Not on [name]  . Marital Status: Not on [name]    Family History: Family History  Problem Relation Age of Onset  . Diabetes Mother  Allergies: No Known Allergies  Medications Prior to Admission  Medication Sig Dispense Refill Last Dose  . aspirin EC 81 MG tablet Take 81 mg by mouth daily. Swallow whole.     . Blood Glucose Monitoring Suppl (TRUE METRIX METER) w/Device KIT Use as directed 1 kit 0   . glucose blood (TRUE METRIX BLOOD GLUCOSE TEST) test strip Use as instructed 100 each 12   . insulin NPH Human (NOVOLIN N) 100 UNIT/ML injection Inject 0.05-0.08 mLs (5-8 Units total) into the skin 2 (two) times daily at 8 am and 10 pm. 8 with breakfast and 5 before bed 10 mL 3   . Insulin Syringe-Needle U-100 (INSULIN SYRINGE .5CC/31GX5/16") 31G X 5/16" 0.5 ML MISC 1 Units by Does not apply route in the morning and at bedtime. 100 each 3   . metFORMIN (GLUCOPHAGE) 500 MG tablet Take 500 mg by mouth 2 (two) times daily with a meal.     . Prenatal Vit-Fe Fumarate-FA (PRENATAL VITAMINS) 28-0.8 MG TABS Take 1 tablet by mouth daily. 30 tablet 9   .  TRUEplus Lancets 28G MISC Use as directed 100 each 4      Review of Systems  All systems reviewed and negative except as stated in HPI  Blood pressure 114/73, pulse 85, temperature 98.1 F (36.7 C), height _0  (1.549 m), weight 63.5 kg, last menstrual period 10/28/2019. General appearance: alert, cooperative and no distress Lungs: no increased work of breathing, no respiratory distress, speaking in full sentences Heart: regular rate and rhythm Abdomen: gravid Pelvic: deferred Extremities: No sign of DVT Presentation: breech Fetal monitoringBaseline: 140 bpm, Variability: Good {> 6 bpm), Accelerations: Reactive and Decelerations: Absent Uterine activityFrequency: Every 2-3 minutes Dilation: 3 Effacement (%): 80 Exam by:: m wilkins rnc   Prenatal labs: ABO, Rh: --/--/B POS (09/07 0900) Antibody: NEG (09/07 0900) Rubella: 7.30 (04/13 1131) RPR: Non Reactive (08/11 0954)  HBsAg: Negative (04/13 1131)  HIV: Non Reactive (08/11 0954)  GBS: Negative/-- (08/18 1340)  Hgb A1c 05/28/20: 6.9 Genetic screening: Declined Anatomy US: Normal   Prenatal Transfer Tool  Maternal Diabetes: Yes:  Diabetes Type:  Insulin/Medication controlled Genetic Screening: Declined Maternal Ultrasounds/Referrals: Normal Fetal Ultrasounds or other Referrals:  None Maternal Substance Abuse:  No Significant Maternal Medications:  Meds include: Other: Metformin, Insulin Significant Maternal Lab Results: Group B Strep negative  Results for orders placed or performed during the hospital encounter of 07/20/20 (from the past 24 hour(s))  Type and screen   Collection Time: 07/20/20  9:00 AM  Result Value Ref Range   ABO/RH(D) B POS    Antibody Screen NEG    Sample Expiration      07/23/2020,2359 Performed at [facility], [place] [address], [place], [place] [address]   Glucose, capillary   Collection Time: 07/20/20  9:12 AM  Result Value Ref Range   Glucose-Capillary 136 (H) 70 - 99 mg/dL  CBC    Collection Time: 07/20/20  9:30 AM  Result Value Ref Range   WBC 7.8 4.0 - 10.5 K/uL   RBC 4.51 3.87 - 5.11 MIL/uL   Hemoglobin 12.7 12.0 - 15.0 g/dL   HCT 37.8 36 - 46 %   MCV 83.8 80.0 - 100.0 fL   MCH 28.2 26.0 - 34.0 pg   MCHC 33.6 30.0 - 36.0 g/dL   RDW 13.6 11.5 - 15.5 %   Platelets 297 150 - 400 K/uL   nRBC 0.0 0.0 - 0.2 %  Comprehensive metabolic panel  Collection Time: 07/20/20  9:30 AM  Result Value Ref Range   Sodium 134 (L) 135 - 145 mmol/L   Potassium 4.0 3.5 - 5.1 mmol/L   Chloride 104 98 - 111 mmol/L   CO2 19 (L) 22 - 32 mmol/L   Glucose, Bld 144 (H) 70 - 99 mg/dL   BUN 11 6 - 20 mg/dL   Creatinine, Ser 0.52 0.44 - 1.00 mg/dL   Calcium 9.3 8.9 - 10.3 mg/dL   Total Protein 6.9 6.5 - 8.1 g/dL   Albumin 3.0 (L) 3.5 - 5.0 g/dL   AST 19 15 - 41 U/L   ALT 11 0 - 44 U/L   Alkaline Phosphatase 179 (H) 38 - 126 U/L   Total Bilirubin 0.4 0.3 - 1.2 mg/dL   GFR calc non Af Amer >60 >60 mL/min   GFR calc Af Amer >60 >60 mL/min   Anion gap 11 5 - 15    Patient Active Problem List   Diagnosis Date Noted  . IUP (intrauterine pregnancy), incidental 07/20/2020  . Excessive fetal growth affecting management of mother in third trimester, antepartum 06/30/2020  . [place] multipara 06/23/2020  . Pre-existing type 2 diabetes mellitus during pregnancy 03/03/2020  . Illiterate 03/03/2020  . Supervision of high risk pregnancy, antepartum 02/23/2020  . History of VBAC 02/23/2020  . Language barrier 02/23/2020  . Diabetes mellitus type 2, uncontrolled, without complications 43/15/4008    Assessment/Plan:  Kyaira Trantham is a 34 y.o. Q7Y1950 at 40w0dhere for IOL for Type 2 diabetes. On admission, pt noted to have breech presentation. After shared decision making with pt (consent performed by Dr. DRosana Hoes, will plan for ECV this afternoon as noted below.  #Labor: Fetus found to be breech on admission. Will hold off on labor induction at this time. Plan for ECV this afternoon.  Will proceed to C/S if unsuccessful. Pt consented and aware as noted above. #Pain: Per patient request #FWB: Cat I strip #ID: [id number] #MOF: Breast and bottle #MOC: Depo #Circ:  declines #Type 2 Diabetes Mellitus: On Metformin and Insulin with good adherence. Takes NPH 8U with breakfast and 5U before bed. Patient last took Insulin last night. Last meal this AM at 7. If sucessful ECV will plan for Q4h CBG checks in latent labor, q2h in active labor. #Breech: Fetus found to be breech on admission today. ECV scheduled for this afternoon given last po intake at 0700. Will plan for C/S if unsuccessful. #Illiteracy: per chart review. Will ensure that patient understands any medication changes on d/c  ASharion Settler DO  07/20/2020, 11:43 AM  Attestation of Supervision of Student:  I confirm that I have verified the information documented in the resident's note and that I have also personally reperformed the history, physical exam and all medical decision making activities.  I have verified that all services and findings are accurately documented in this student's note; and I agree with management and plan as outlined in the documentation. I have also made any necessary editorial changes.  ARanda Ngo MSienna Plantationfor [facility] CSlaterGroup 07/20/2020 1:20 PM

## 2020-07-20 NOTE — Progress Notes (Signed)
External Cephalic Version Note  Patient is 34 y.o. [id number] at [redacted]w[redacted]d who presents for external cephalic version for oblique presentation. I reviewed risks of external cephalic version including risk of pain, bleeding, abruption, fetal intolerance, injury to fetus, failure for turn fetus. Reviewed risk of need for urgent primary c-section for fetal intolerance, including risks of c-section of infection, bleeding, damage to surrounding tissue and organs. Reviewed that if she remains breech, would be scheduled for primary c-section. She verbalizes understanding and desires to proceed, consent signed.  Terbutaline given. Timeout performed.   The fetus was visualized on [place] to be oblique with head to maternal right. A forward roll toward maternal right was attempted and the fetus was attempted to begently turned towards the cephalic position. The fetal heart rate was checked several times during the procedure via ultrasound and found to be low, recovered quickly and then remained within normal range. The fetus was unable to be turned cephalic. A backwards roll was attempted with no success. After several tries, the procedure was halted and the patient was put back on continuous fetal monitoring.   The patient tolerated the procedure well. Given that fetus remains malpresentation, recommended proceeding with c-section, she is agreeable. Consent previously signed.     Baldemar Lenis, M.D. [facility] [employer]

## 2020-07-20 NOTE — Anesthesia Postprocedure Evaluation (Signed)
Anesthesia Post Note  Patient: Angelica [name]  Procedure(s) Performed: CESAREAN SECTION (N/A )     Patient location during evaluation: PACU Anesthesia Type: Spinal Level of consciousness: awake and alert and oriented Pain management: pain level controlled Vital Signs Assessment: post-procedure vital signs reviewed and stable Respiratory status: spontaneous breathing, nonlabored ventilation and respiratory function stable Cardiovascular status: blood pressure returned to baseline Postop Assessment: no apparent nausea or vomiting, spinal receding, no headache and no backache Anesthetic complications: no   No complications documented.  Last Vitals:  Vitals:   07/20/20 1800 07/20/20 1815  BP: (!) 88/55 102/63  Pulse: 61 (!) 50  Resp: 14 14  Temp:    SpO2: 98% 99%    Last Pain:  Vitals:   07/20/20 1815  TempSrc:   PainSc: 0-No pain   Pain Goal:    LLE Motor Response: Purposeful movement (07/20/20 1815) LLE Sensation: Tingling (07/20/20 1815) RLE Motor Response: Purposeful movement (07/20/20 1815) RLE Sensation: Tingling (07/20/20 1815)     Epidural/Spinal Function Cutaneous sensation: Tingles (07/20/20 1815), Patient able to flex knees: Yes (07/20/20 1815), Patient able to lift hips off bed: No (07/20/20 1815), Back pain beyond tenderness at insertion site: No (07/20/20 1815), Progressively worsening motor and/or sensory loss: No (07/20/20 1815), Bowel and/or bladder incontinence post epidural: No (07/20/20 1815)  Kaylyn Layer

## 2020-07-21 ENCOUNTER — Encounter (HOSPITAL_COMMUNITY): Payer: Self-pay | Admitting: Obstetrics and Gynecology

## 2020-07-21 DIAGNOSIS — Z7984 Long term (current) use of oral hypoglycemic drugs: Secondary | ICD-10-CM

## 2020-07-21 DIAGNOSIS — D62 Acute posthemorrhagic anemia: Secondary | ICD-10-CM

## 2020-07-21 DIAGNOSIS — O2413 Pre-existing diabetes mellitus, type 2, in the puerperium: Secondary | ICD-10-CM

## 2020-07-21 DIAGNOSIS — O9903 Anemia complicating the puerperium: Secondary | ICD-10-CM

## 2020-07-21 LAB — GLUCOSE, CAPILLARY
Glucose-Capillary: 139 mg/dL — ABNORMAL HIGH (ref 70–99)
Glucose-Capillary: 118 mg/dL — ABNORMAL HIGH (ref 70–99)
Glucose-Capillary: 149 mg/dL — ABNORMAL HIGH (ref 70–99)

## 2020-07-21 LAB — CBC
HCT: 23.5 % — ABNORMAL LOW (ref 36.0–46.0)
Hemoglobin: 7.7 g/dL — ABNORMAL LOW (ref 12.0–15.0)
MCH: 27.5 pg (ref 26.0–34.0)
MCHC: 32.8 g/dL (ref 30.0–36.0)
MCV: 83.9 fL (ref 80.0–100.0)
Platelets: 182 10*3/uL (ref 150–400)
RBC: 2.8 MIL/uL — ABNORMAL LOW (ref 3.87–5.11)
RDW: 13.6 % (ref 11.5–15.5)
WBC: 9 10*3/uL (ref 4.0–10.5)
nRBC: 0 % (ref 0.0–0.2)

## 2020-07-21 MED ORDER — SODIUM CHLORIDE 0.9 % IV SOLN
510.0000 mg | Freq: Once | INTRAVENOUS | Status: AC
  Administered 2020-07-21: 510 mg via INTRAVENOUS
  Filled 2020-07-21: qty 17

## 2020-07-21 MED ORDER — FERROUS SULFATE 325 (65 FE) MG PO TABS: 325.0000 mg | ORAL_TABLET | ORAL | Status: DC

## 2020-07-21 MED ORDER — FERROUS SULFATE 325 (65 FE) MG PO TABS
325.0000 mg | ORAL_TABLET | ORAL | Status: DC
  Administered 2020-07-22: 325 mg via ORAL
  Filled 2020-07-21: qty 1

## 2020-07-21 NOTE — Progress Notes (Signed)
In house interpreter used to communicate with patient. We discussed the patient using her call bell to notify RN to check her blood sugar before she eats her dinner. Patient does not know right now when she will eat dinner, but agreed to call RN first before eating.

## 2020-07-21 NOTE — Progress Notes (Signed)
POSTPARTUM PROGRESS NOTE  POD #1  Subjective:  Angelica [name] is a 34 y.o. [id number] s/p rLTCS at [redacted]w[redacted]d.  She reports she doing well. No acute events overnight.. She denies any problems with ambulating, voiding or po intake. Denies nausea or vomiting. She has passed flatus. Pain is well controlled.  Lochia is appropriate.  Objective: Blood pressure (!) 76/44, pulse 65, temperature 98.5 F (36.9 C), temperature source Oral, resp. rate 18, height 5\' 1"  (1.549 m), weight 63.5 kg, last menstrual period 10/28/2019, SpO2 98 %, unknown if currently breastfeeding.  Physical Exam:  General: alert, cooperative and no distress Chest: no respiratory distress Heart:regular rate, distal pulses intact Abdomen: soft, nontender,  Uterine Fundus: firm, appropriately tender DVT Evaluation: No calf swelling or tenderness Extremities: No LE edema edema Skin: warm, dry; incision clean/dry/intact w/ wound vac in place   Recent Labs    07/20/20 1844 07/21/20 0500  HGB 9.2* 7.7*  HCT 27.8* 23.5*    Assessment/Plan: Angelica [name] 09/20/20 is a 34 y.o. 20 s/p rLTCS at [redacted]w[redacted]d for failed ECV.  POD#1 - Doing welll; pain well controlled. 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 Then start po ferrous sulfate  T2DM: Was unable to obtain fasting CBG this AM. Continue Metformin. SSI to determine if needs insulin at d/c.  Contraception: Depo  Feeding: Breast   Dispo: Plan for discharge POD #2 or #3.   LOS: 1 day   [redacted]w[redacted]d, D.O. OB Fellow  07/21/2020, 8:37 AM

## 2020-07-21 NOTE — Progress Notes (Signed)
Patient found eating breakfast at 0715. FBS was not done by prior RN on 7p-7a shift. MD notified to asked for guidance on how to proceed with SS insulin. Glucose done after patient ate=139. Dr. Myriam Jacobson stated to skip 8 am SSI and monitor next ac glucose and follow SSI guidelines. Spanish interpreter contacted to educate patient to call prior to eating meals so RN can obtain glucose levels.

## 2020-07-22 ENCOUNTER — Encounter: Payer: Self-pay | Admitting: *Deleted

## 2020-07-22 ENCOUNTER — Encounter: Payer: Self-pay | Admitting: Obstetrics & Gynecology

## 2020-07-22 DIAGNOSIS — Z794 Long term (current) use of insulin: Secondary | ICD-10-CM

## 2020-07-22 LAB — GLUCOSE, CAPILLARY
Glucose-Capillary: 100 mg/dL — ABNORMAL HIGH (ref 70–99)
Glucose-Capillary: 126 mg/dL — ABNORMAL HIGH (ref 70–99)
Glucose-Capillary: 95 mg/dL (ref 70–99)
Glucose-Capillary: 105 mg/dL — ABNORMAL HIGH (ref 70–99)

## 2020-07-22 MED ORDER — MEDROXYPROGESTERONE ACETATE 150 MG/ML IM SUSP
150.0000 mg | Freq: Once | INTRAMUSCULAR | Status: AC
  Administered 2020-07-23: 150 mg via INTRAMUSCULAR
  Filled 2020-07-22: qty 1

## 2020-07-22 NOTE — Progress Notes (Signed)
POSTPARTUM PROGRESS NOTE  Subjective: Angelica [name] is a 34 y.o. [id number] s/p rLTCS at [redacted]w[redacted]d secondary to breech presentation s/p failed ECV. She reports she doing well. No acute events overnight. She denies any problems with ambulating, voiding or po intake. Denies nausea or vomiting. She has passed flatus and also BM x1. Pain is well controlled.  Lochia is mild.  Objective: Blood pressure 100/63, pulse 63, temperature 98 F (36.7 C), temperature source Oral, resp. rate 16, height 5\' 1"  (1.549 m), weight 63.5 kg, last menstrual period 10/28/2019, SpO2 97 %, unknown if currently breastfeeding.  Physical Exam:  General: alert, cooperative and no distress Chest: no respiratory distress Abdomen: soft, appropriately tender, [place] wound vac clean/dry/intact Uterine Fundus: firm and at level of umbilicus Extremities: No calf swelling or tenderness  no LE edema  Recent Labs    07/20/20 1844 07/21/20 0500  HGB 9.2* 7.7*  HCT 27.8* 23.5*    Assessment/Plan: Angelica [name] is a 34 y.o. 20 s/p rLTCS at [redacted]w[redacted]d for breech presentation s/p failed ECV.  Routine Postpartum Care: Doing well, pain well-controlled.  -- Continue routine care, lactation support  -- Contraception: plan for depo prior to discharge -- Feeding: breast -- T2DM: pt on insulin and MTF in pregnancy. Minimal SSI required postpartum. Plan to discharge on MTF with f/u A1c at 4 week PP appt. Will need to establish with PCP. -- Anemia: blood loss anemia 2/2 PPH EBL >1500 s/p TXA. Given Hgb 9.2 >7.7 s/p IV feraheme on 9/9.  Dispo: Plan for discharge POD#3 (will need meds to beds for MTF).  11/9, MD OB Fellow, Faculty Practice 07/22/2020 6:06 PM

## 2020-07-22 NOTE — Progress Notes (Deleted)
Post Partum Day 2 Subjective: up ad lib, voiding, tolerating PO, + flatus and no dizziness  Objective: Blood pressure (!) 91/57, pulse 64, temperature 98.1 F (36.7 C), temperature source Oral, resp. rate 16, height 5\' 1"  (1.549 m), weight 63.5 kg, last menstrual period 10/28/2019, SpO2 97 %, unknown if currently breastfeeding.  Physical Exam:  General: alert, cooperative and no distress Lochia: appropriate Uterine Fundus: firm Incision: healing well DVT Evaluation: No evidence of DVT seen on physical exam.  Recent Labs    07/20/20 1844 07/21/20 0500  HGB 9.2* 7.7*  HCT 27.8* 23.5*   CBG (last 3)  Recent Labs    07/21/20 1149 07/21/20 1835 07/22/20 0716  GLUCAP 118* 149* 100*   Assessment/Plan: Doing well.  Consider hgb recheck if becomes symptomatic or has increased bleeding.  Discuss transportation with social work.  Will perform diabetes teaching and start on metformin BID    LOS: 2 days   09/21/20 07/22/2020, 8:58 AM  Patient ID: [id number], female   DOB: 06-Aug-1986, 34 y.o.   MRN: [id number]

## 2020-07-22 NOTE — Lactation Note (Signed)
This note was copied from a baby's chart. Lactation Consultation Note  Patient Name: Angelica [name] [id number] Date: 07/22/2020    Per night shift LC:  Mother denies a lactation consult.   Maternal Data    Feeding Feeding Type: Breast Fed  LATCH Score                   Interventions    Lactation Tools Discussed/Used     Consult Status      Jahlon Baines R Sabrina Keough 07/22/2020, 7:40 AM

## 2020-07-23 DIAGNOSIS — Z98891 History of uterine scar from previous surgery: Secondary | ICD-10-CM

## 2020-07-23 DIAGNOSIS — D62 Acute posthemorrhagic anemia: Secondary | ICD-10-CM

## 2020-07-23 LAB — GLUCOSE, CAPILLARY: Glucose-Capillary: 81 mg/dL (ref 70–99)

## 2020-07-23 MED ORDER — COCONUT OIL OIL: 1.0000 "application " | TOPICAL_OIL | 0 refills | Status: DC | PRN

## 2020-07-23 MED ORDER — ACETAMINOPHEN 500 MG PO TABS: 1000.0000 mg | ORAL_TABLET | Freq: Four times a day (QID) | ORAL | 0 refills | Status: DC | PRN

## 2020-07-23 MED ORDER — FERROUS SULFATE 325 (65 FE) MG PO TABS: 325.0000 mg | ORAL_TABLET | ORAL | 3 refills | Status: DC

## 2020-07-23 MED ORDER — METFORMIN HCL 500 MG PO TABS
500.0000 mg | ORAL_TABLET | Freq: Two times a day (BID) | ORAL | 1 refills | Status: DC
Start: 2020-07-23 — End: 2023-03-27

## 2020-07-23 MED ORDER — IBUPROFEN 800 MG PO TABS
800.0000 mg | ORAL_TABLET | Freq: Three times a day (TID) | ORAL | 0 refills | Status: DC
Start: 2020-07-23 — End: 2023-01-16

## 2020-07-23 MED FILL — IBUPROFEN 800 MG TAB: 800 | 10 days supply | Qty: 30 | Fill #0

## 2020-07-23 MED FILL — FERROUS SULFATE 325 MG TAB: 325 (65 FE) | 60 days supply | Qty: 30 | Fill #0

## 2020-07-23 MED FILL — metFORMIN HCL 500 MG TABS: 500 | 30 days supply | Qty: 60 | Fill #0

## 2020-07-23 NOTE — Discharge Instructions (Signed)
Please keep your provena wound vac in place until your clinic follow-up appointment in 1 week. It will be very important to continue your metformin for diabetes twice daily as prescribed. You do not need to continue insulin at this time, but it will be important to establish with a primary care physician for your diabetes.  Please call the clinic if any concerns prior to your postpartum appointment in 4 weeks.  Postpartum Care After Cesarean Delivery This sheet gives you information about how to care for yourself from the time you deliver your baby to up to 6-12 weeks after delivery (postpartum period). Your health care provider may also give you more specific instructions. If you have problems or questions, contact your health care provider. Follow these instructions at home: Medicines  Take over-the-counter and prescription medicines only as told by your health care provider.  If you were prescribed an antibiotic medicine, take it as told by your health care provider. Do not stop taking the antibiotic even if you start to feel better.  Ask your health care provider if the medicine prescribed to you: ? Requires you to avoid driving or using heavy machinery. ? Can cause constipation. You may need to take actions to prevent or treat constipation, such as:  Drink enough fluid to keep your urine pale yellow.  Take over-the-counter or prescription medicines.  Eat foods that are high in fiber, such as beans, whole grains, and fresh fruits and vegetables.  Limit foods that are high in fat and processed sugars, such as fried or sweet foods. Activity  Gradually return to your normal activities as told by your health care provider.  Avoid activities that take a lot of effort and energy (are strenuous) until approved by your health care provider. Walking at a slow to moderate pace is usually safe. Ask your health care provider what activities are safe for you. ? Do not lift anything that is  heavier than your baby or 10 lb (4.5 kg) as told by your health care provider. ? Do not vacuum, climb stairs, or drive a car for as long as told by your health care provider.  If possible, have someone help you at home until you are able to do your usual activities yourself.  Rest as much as possible. Try to rest or take naps while your baby is sleeping. Vaginal bleeding  It is normal to have vaginal bleeding (lochia) after delivery. Wear a sanitary pad to absorb vaginal bleeding and discharge. ? During the first week after delivery, the amount and appearance of lochia is often similar to a menstrual period. ? Over the next few weeks, it will gradually decrease to a dry, yellow-brown discharge. ? For most women, lochia stops completely by 4-6 weeks after delivery. Vaginal bleeding can vary from woman to woman.  Change your sanitary pads frequently. Watch for any changes in your flow, such as: ? A sudden increase in volume. ? A change in color. ? Large blood clots.  If you pass a blood clot, save it and call your health care provider to discuss. Do not flush blood clots down the toilet before you get instructions from your health care provider.  Do not use tampons or douches until your health care provider says this is safe.  If you are not breastfeeding, your period should return 6-8 weeks after delivery. If you are breastfeeding, your period may return anytime between 8 weeks after delivery and the time that you stop breastfeeding. Perineal care  If your C-section (Cesarean section) was unplanned, and you were allowed to labor and push before delivery, you may have pain, swelling, and discomfort of the tissue between your vaginal opening and your anus (perineum). You may also have an incision in the tissue (episiotomy) or the tissue may have torn during delivery. Follow these instructions as told by your health care provider: ? Keep your perineum clean and dry as told by your health care  provider. Use medicated pads and pain-relieving sprays and creams as directed. ? If you have an episiotomy or vaginal tear, check the area every day for signs of infection. Check for:  Redness, swelling, or pain.  Fluid or blood.  Warmth.  Pus or a bad smell. ? You may be given a squirt bottle to use instead of wiping to clean the perineum area after you go to the bathroom. As you start healing, you may use the squirt bottle before wiping yourself. Make sure to wipe gently. ? To relieve pain caused by an episiotomy, vaginal tear, or hemorrhoids, try taking a warm sitz bath 2-3 times a day. A sitz bath is a warm water bath that is taken while you are sitting down. The water should only come up to your hips and should cover your buttocks. Breast care  Within the first few days after delivery, your breasts may feel heavy, full, and uncomfortable (breast engorgement). You may also have milk leaking from your breasts. Your health care provider can suggest ways to help relieve breast discomfort. Breast engorgement should go away within a few days.  If you are breastfeeding: ? Wear a bra that supports your breasts and fits you well. ? Keep your nipples clean and dry. Apply creams and ointments as told by your health care provider. ? You may need to use breast pads to absorb milk leakage. ? You may have uterine contractions every time you breastfeed for several weeks after delivery. Uterine contractions help your uterus return to its normal size. ? If you have any problems with breastfeeding, work with your health care provider or a Advertising copywriter.  If you are not breastfeeding: ? Avoid touching your breasts as this can make your breasts produce more milk. ? Wear a well-fitting bra and use cold packs to help with swelling. ? Do not squeeze out (express) milk. This causes you to make more milk. Intimacy and sexuality  Ask your health care provider when you can engage in sexual activity.  This may depend on your: ? Risk of infection. ? Healing rate. ? Comfort and desire to engage in sexual activity.  You are able to get pregnant after delivery, even if you have not had your period. If desired, talk with your health care provider about methods of family planning or birth control (contraception). Lifestyle  Do not use any products that contain nicotine or tobacco, such as cigarettes, e-cigarettes, and chewing tobacco. If you need help quitting, ask your health care provider.  Do not drink alcohol, especially if you are breastfeeding. Eating and drinking   Drink enough fluid to keep your urine pale yellow.  Eat high-fiber foods every day. These may help prevent or relieve constipation. High-fiber foods include: ? Whole grain cereals and breads. ? Brown rice. ? Beans. ? Fresh fruits and vegetables.  Take your prenatal vitamins until your postpartum checkup or until your health care provider tells you it is okay to stop. General instructions  Keep all follow-up visits for you and your baby as told by  your health care provider. Most women visit their health care provider for a postpartum checkup within the first 3-6 weeks after delivery. Contact a health care provider if you:  Feel unable to cope with the changes that a new baby brings to your life, and these feelings do not go away.  Feel unusually sad or worried.  Have breasts that are painful, hard, or turn red.  Have a fever.  Have trouble holding urine or keeping urine from leaking.  Have little or no interest in activities you used to enjoy.  Have not breastfed at all and you have not had a menstrual period for 12 weeks after delivery.  Have stopped breastfeeding and you have not had a menstrual period for 12 weeks after you stopped breastfeeding.  Have questions about caring for yourself or your baby.  Pass a blood clot from your vagina. Get help right away if you:  Have chest pain.  Have difficulty  breathing.  Have sudden, severe leg pain.  Have severe pain or cramping in your abdomen.  Bleed from your vagina so much that you fill more than one sanitary pad in one hour. Bleeding should not be heavier than your heaviest period.  Develop a severe headache.  Faint.  Have blurred vision or spots in your vision.  Have a bad-smelling vaginal discharge.  Have thoughts about hurting yourself or your baby. If you ever feel like you may hurt yourself or others, or have thoughts about taking your own life, get help right away. You can go to your nearest emergency department or call:  Your local emergency services (911 in the [place]  A suicide crisis helpline, such as the National Suicide Prevention Lifeline at (548)387-7687. This is open 24 hours a day. Summary  The period of time from when you deliver your baby to up to 6-12 weeks after delivery is called the postpartum period.  Gradually return to your normal activities as told by your health care provider.  Keep all follow-up visits for you and your baby as told by your health care provider. This information is not intended to replace advice given to you by your health care provider. Make sure you discuss any questions you have with your health care provider. Document Revised: 06/19/2018 Document Reviewed: 06/19/2018 Elsevier Patient Education  2020 [employer].

## 2020-07-30 ENCOUNTER — Other Ambulatory Visit: Payer: Self-pay

## 2020-07-30 ENCOUNTER — Ambulatory Visit: Payer: Self-pay | Admitting: *Deleted

## 2020-07-30 VITALS — BP 121/77 | HR 76 | Ht 61.0 in | Wt 127.6 lb

## 2020-07-30 DIAGNOSIS — Z4889 Encounter for other specified surgical aftercare: Secondary | ICD-10-CM

## 2020-07-30 NOTE — Progress Notes (Signed)
Pt in office earlier today for incision check. Interpreter [employer] present for encounter.  Prevena dressing removed and incision was found to be well healed. No redness, swelling or drainage noted. Cleansing of wound instructions given. Per medication reconciliation, it was learned that pt has been taking one iron tablet every 8 hrs daily although the Rx was given as one tablet every other [place]. I confirmed with Dr. Earlene Plater that pt should take as originally prescribed. Pt was advised of this information. She drinks plenty of water daily and denies constipation. Pt voiced understanding pf all information and instructions given.

## 2020-08-02 NOTE — Progress Notes (Signed)
I have reviewed this chart and agree with the RN/CMA assessment and management.    K. Meryl Carlette Palmatier, M.D. Attending [facility] (Faculty Practice)   

## 2020-08-24 ENCOUNTER — Ambulatory Visit: Payer: Self-pay | Admitting: Student

## 2020-08-24 ENCOUNTER — Other Ambulatory Visit: Payer: Self-pay

## 2020-12-10 LAB — GLUCOSE, POCT (MANUAL RESULT ENTRY): POC Glucose: 436 mg/dl — AB (ref 70–99)

## 2021-08-11 ENCOUNTER — Emergency Department (HOSPITAL_COMMUNITY)
Admission: EM | Admit: 2021-08-11 | Discharge: 2021-08-12 | Disposition: A | Discharge status: Home / Self Care | Payer: Self-pay | Attending: Physician Assistant | Admitting: Physician Assistant

## 2021-08-11 ENCOUNTER — Encounter (HOSPITAL_COMMUNITY): Payer: Self-pay

## 2021-08-11 DIAGNOSIS — Z5321 Procedure and treatment not carried out due to patient leaving prior to being seen by health care provider: Secondary | ICD-10-CM | POA: Insufficient documentation

## 2021-08-11 DIAGNOSIS — K4090 Unilateral inguinal hernia, without obstruction or gangrene, not specified as recurrent: Secondary | ICD-10-CM | POA: Insufficient documentation

## 2021-08-11 LAB — COMPREHENSIVE METABOLIC PANEL
ALT: 19 U/L (ref 0–44)
AST: 21 U/L (ref 15–41)
Albumin: 4.2 g/dL (ref 3.5–5.0)
Alkaline Phosphatase: 171 U/L — ABNORMAL HIGH (ref 38–126)
Anion gap: 11 (ref 5–15)
BUN: 14 mg/dL (ref 6–20)
CO2: 25 mmol/L (ref 22–32)
Calcium: 9.5 mg/dL (ref 8.9–10.3)
Chloride: 95 mmol/L — ABNORMAL LOW (ref 98–111)
Creatinine, Ser: 0.43 mg/dL — ABNORMAL LOW (ref 0.44–1.00)
GFR, Estimated: >60 mL/min (ref >60)
Glucose, Bld: 457 mg/dL — ABNORMAL HIGH (ref 70–99)
Potassium: 3.9 mmol/L (ref 3.5–5.1)
Sodium: 131 mmol/L — ABNORMAL LOW (ref 135–145)
Total Bilirubin: 0.5 mg/dL (ref 0.3–1.2)
Total Protein: 7.6 g/dL (ref 6.5–8.1)

## 2021-08-11 LAB — URINALYSIS, ROUTINE W REFLEX MICROSCOPIC
Bacteria, UA: NONE SEEN
Bilirubin Urine: NEGATIVE
Glucose, UA: >=500 mg/dL — AB
Hgb urine dipstick: NEGATIVE
Ketones, ur: NEGATIVE mg/dL
Leukocytes,Ua: NEGATIVE
Nitrite: NEGATIVE
Protein, ur: NEGATIVE mg/dL
Specific Gravity, Urine: 1.036 — ABNORMAL HIGH (ref 1.005–1.030)
pH: 7 (ref 5.0–8.0)

## 2021-08-11 LAB — CBC WITH DIFFERENTIAL/PLATELET
Abs Immature Granulocytes: 0.04 10*3/uL (ref 0.00–0.07)
Basophils Absolute: 0 10*3/uL (ref 0.0–0.1)
Basophils Relative: 0 %
Eosinophils Absolute: 0.2 10*3/uL (ref 0.0–0.5)
Eosinophils Relative: 2 %
HCT: 41.3 % (ref 36.0–46.0)
Hemoglobin: 14.1 g/dL (ref 12.0–15.0)
Immature Granulocytes: 0 %
Lymphocytes Relative: 35 %
Lymphs Abs: 3.5 10*3/uL (ref 0.7–4.0)
MCH: 28.8 pg (ref 26.0–34.0)
MCHC: 34.1 g/dL (ref 30.0–36.0)
MCV: 84.3 fL (ref 80.0–100.0)
Monocytes Absolute: 0.7 10*3/uL (ref 0.1–1.0)
Monocytes Relative: 7 %
Neutro Abs: 5.4 10*3/uL (ref 1.7–7.7)
Neutrophils Relative %: 56 %
Platelets: 424 10*3/uL — ABNORMAL HIGH (ref 150–400)
RBC: 4.9 MIL/uL (ref 3.87–5.11)
RDW: 12.3 % (ref 11.5–15.5)
WBC: 9.9 10*3/uL (ref 4.0–10.5)
nRBC: 0 % (ref 0.0–0.2)

## 2021-08-11 NOTE — ED Provider Notes (Signed)
Emergency Medicine Provider Triage Evaluation Note  Angelica [name] , a 35 y.o. female  was evaluated in triage.  Pt complains of left lower quadrant/groin pain which started 5 days ago.  States she has severe pain with movement, nausea secondary to pain.  Has not had no issues with her bowel movements.  Denies any fevers or chills.  Also endorses flank pain that was right-sided radiating to the center of her back about 2 weeks ago which is been intermittent since.  Review of Systems  Positive: As above Negative: As above  Physical Exam  BP 116/84 (BP Location: Right Arm)   Pulse 95   Temp 99.2 F (37.3 C) (Oral)   Resp 15   SpO2 100%  Gen:   Awake, no distress   Resp:  Normal effort  MSK:   Moves extremities without difficulty  Other:  Left lower quadrant/groin tenderness.  No palpable mass.  No CVA tenderness bilaterally.  Medical Decision Making  Medically screening exam initiated at 10:48 PM.  Appropriate orders placed.  Karyl Jamelle Rushing was informed that the remainder of the evaluation will be completed by another provider, this initial triage assessment does not replace that evaluation, and the importance of remaining in the ED until their evaluation is complete.     Mare Ferrari, PA-C 08/11/21 2249    Derwood Kaplan, MD 08/12/21 [phone]

## 2021-08-11 NOTE — ED Triage Notes (Signed)
Pt states that she has a mass around her groin area that has been there for the past 5 days, nausea from the pain

## 2021-08-12 ENCOUNTER — Emergency Department (HOSPITAL_COMMUNITY): Payer: Self-pay

## 2021-08-12 LAB — PREGNANCY, URINE: Preg Test, Ur: NEGATIVE

## 2021-08-12 MED ORDER — IOHEXOL 350 MG/ML SOLN
75.0000 mL | Freq: Once | INTRAVENOUS | Status: AC | PRN
  Administered 2021-08-12: 75 mL via INTRAVENOUS

## 2021-08-12 NOTE — ED Notes (Signed)
Pt called for vitals x2, no response. 

## 2021-08-12 NOTE — ED Notes (Signed)
Pt called for vitals, no response. 

## 2022-03-01 ENCOUNTER — Emergency Department (HOSPITAL_COMMUNITY): Payer: Self-pay

## 2022-03-01 ENCOUNTER — Encounter (HOSPITAL_COMMUNITY): Payer: Self-pay | Admitting: Emergency Medicine

## 2022-03-01 ENCOUNTER — Emergency Department (HOSPITAL_COMMUNITY)
Admission: EM | Admit: 2022-03-01 | Discharge: 2022-03-01 | Disposition: A | Discharge status: Home / Self Care | Payer: Self-pay | Attending: Emergency Medicine | Admitting: Emergency Medicine

## 2022-03-01 ENCOUNTER — Other Ambulatory Visit: Payer: Self-pay

## 2022-03-01 DIAGNOSIS — R000 Tachycardia, unspecified: Secondary | ICD-10-CM | POA: Insufficient documentation

## 2022-03-01 DIAGNOSIS — H538 Other visual disturbances: Secondary | ICD-10-CM | POA: Insufficient documentation

## 2022-03-01 DIAGNOSIS — R002 Palpitations: Secondary | ICD-10-CM | POA: Insufficient documentation

## 2022-03-01 DIAGNOSIS — Z7984 Long term (current) use of oral hypoglycemic drugs: Secondary | ICD-10-CM | POA: Insufficient documentation

## 2022-03-01 DIAGNOSIS — R6884 Jaw pain: Secondary | ICD-10-CM | POA: Insufficient documentation

## 2022-03-01 DIAGNOSIS — K029 Dental caries, unspecified: Secondary | ICD-10-CM | POA: Insufficient documentation

## 2022-03-01 DIAGNOSIS — R5383 Other fatigue: Secondary | ICD-10-CM | POA: Insufficient documentation

## 2022-03-01 DIAGNOSIS — E1165 Type 2 diabetes mellitus with hyperglycemia: Secondary | ICD-10-CM | POA: Insufficient documentation

## 2022-03-01 DIAGNOSIS — R519 Headache, unspecified: Secondary | ICD-10-CM | POA: Insufficient documentation

## 2022-03-01 DIAGNOSIS — R110 Nausea: Secondary | ICD-10-CM | POA: Insufficient documentation

## 2022-03-01 DIAGNOSIS — R0789 Other chest pain: Secondary | ICD-10-CM | POA: Insufficient documentation

## 2022-03-01 LAB — SEDIMENTATION RATE: Sed Rate: 24 mm/hr — ABNORMAL HIGH (ref 0–22)

## 2022-03-01 LAB — BASIC METABOLIC PANEL
Anion gap: 10 (ref 5–15)
BUN: 10 mg/dL (ref 6–20)
CO2: 25 mmol/L (ref 22–32)
Calcium: 9.7 mg/dL (ref 8.9–10.3)
Chloride: 97 mmol/L — ABNORMAL LOW (ref 98–111)
Creatinine, Ser: 0.45 mg/dL (ref 0.44–1.00)
GFR, Estimated: >60 mL/min (ref >60)
Glucose, Bld: 279 mg/dL — ABNORMAL HIGH (ref 70–99)
Potassium: 4.1 mmol/L (ref 3.5–5.1)
Sodium: 132 mmol/L — ABNORMAL LOW (ref 135–145)

## 2022-03-01 LAB — CBC
HCT: 42.1 % (ref 36.0–46.0)
Hemoglobin: 14.9 g/dL (ref 12.0–15.0)
MCH: 29.4 pg (ref 26.0–34.0)
MCHC: 35.4 g/dL (ref 30.0–36.0)
MCV: 83 fL (ref 80.0–100.0)
Platelets: 433 10*3/uL — ABNORMAL HIGH (ref 150–400)
RBC: 5.07 MIL/uL (ref 3.87–5.11)
RDW: 12 % (ref 11.5–15.5)
WBC: 7.2 10*3/uL (ref 4.0–10.5)
nRBC: 0 % (ref 0.0–0.2)

## 2022-03-01 LAB — TROPONIN I (HIGH SENSITIVITY)
Troponin I (High Sensitivity): 12 ng/L (ref <18)
Troponin I (High Sensitivity): 13 ng/L (ref <18)

## 2022-03-01 LAB — I-STAT BETA HCG BLOOD, ED (MC, WL, AP ONLY): I-stat hCG, quantitative: <5 m[IU]/mL (ref <5)

## 2022-03-01 LAB — C-REACTIVE PROTEIN: CRP: 0.6 mg/dL (ref <1.0)

## 2022-03-01 LAB — D-DIMER, QUANTITATIVE: D-Dimer, Quant: <0.27 ug/mL-FEU (ref 0.00–0.50)

## 2022-03-01 LAB — TSH: TSH: 1.172 u[IU]/mL (ref 0.350–4.500)

## 2022-03-01 MED ORDER — PENICILLIN V POTASSIUM 500 MG PO TABS: 500.0000 mg | ORAL_TABLET | Freq: Four times a day (QID) | ORAL | 0 refills | Status: AC

## 2022-03-01 MED ORDER — ACETAMINOPHEN 500 MG PO TABS
1000.0000 mg | ORAL_TABLET | Freq: Once | ORAL | Status: AC
  Administered 2022-03-01: 1000 mg via ORAL
  Filled 2022-03-01: qty 2

## 2022-03-01 MED ORDER — SODIUM CHLORIDE 0.9 % IV BOLUS
1000.0000 mL | Freq: Once | INTRAVENOUS | Status: AC
  Administered 2022-03-01: 1000 mL via INTRAVENOUS

## 2022-03-01 NOTE — Discharge Instructions (Addendum)
?  Fue un placer atenderte hoy! ? ?Sus laboratorios e im?genes de Secretary/administrator en general normales hoy. Tienes una recarga de tu Metformina (medicamento para diab?ticos) en tu farmacia. Aseg?rese de recoger el medicamento y tomarlo seg?n lo prescrito. Tambi?n estamos prescribiendo antibi?ticos hoy seg?n las indicaciones hasta que termine para su dolor de [place]. He proporcionado un folleto de recursos dentales en el ?rea. Aseg?rese de continuar tomando su glucosa en casa seg?n lo recomendado. Llame a su proveedor de atenci?n primaria para programar una cita de seguimiento con respecto a la visita al servicio de urgencias de hoy.Tambi?n revisamos su nivel de tiroides hoy. Puede hacer un seguimiento de esos resultados a trav?s de MyChart, hay instrucciones en su documentaci?n de alta sobre c?mo descargar esto Regrese a la sala de emergencias si experimenta un aumento o empeoramiento del dolor en el pecho, dificultad para respirar, empeoramiento del dolor de cabeza, cambios en la visi?n, empeoramiento de los s?ntomas. ? ? ?It was a pleasure taking care of you today! ? ?Your labs and imaging today were overall unremarkable today. You have a refill of your Metformin (diabetic medication) at your pharmacy.  Make sure that you pick up the medication and take it as prescribed.  We are also prescribing antibiotics today as directed until finished for your tooth pain. I have provided a handout for dental resources in the area. Ensure that you  continue to take your glucose at home as recommended.   Call your primary care provider set up a follow-up appointment regarding today's ED visit.  We also checked your thyroid level today. You may follow up on those results via MyChart, there are instructions on your discharge paperwork on how to download this. Return to the emergency department for experiencing increasing/worsening chest pain, trouble breathing, worsening headache, vision changes, worsening symptoms. ?

## 2022-03-01 NOTE — ED Notes (Signed)
Pt transported to CT ?

## 2022-03-01 NOTE — ED Provider Notes (Signed)
?[facility] EMERGENCY DEPARTMENT ?Provider Note ? ? ?CSN: [id number] ?Arrival date & time: 03/01/22  2878 ? ?  ? ?History ? ?Chief Complaint  ?Patient presents with  ? Chest Pain  ? Headache  ? ? ?Angelica [name] is a 36 y.o. female with a past medical history of diabetes who presents to the emergency department complaining of intermittent left-sided chest pain onset yesterday.  Patient notes that her chest pain would last approximately 5 minutes and her last episode was prior to her room assignment in the ED (approximately greater than 30 minutes).  Patient also notes she has increased fatigue when she bends over.  Has associated jaw pain that is worse with eating and drinking.  She has a headache that is been ongoing for 3 weeks and localized to the occipital region.  Pt has associated intermittent blurred vision, fatigue, left upper dental pain, resolved nausea, resolved palpitations. Pt doesn't wear glasses or contacts. Denies history of similar symptoms.  Patient has dentures in place and denies them being tight however notes that they are a little loose. Has not tried any medications for her symptoms.  Denies shortness of breath, diaphoresis, vomiting. Patient denies past medical history of MI, cardiac catheterization, stents, hypertension, coronary artery disease.  Patient notes that she has not taken her metformin within the past weeks because she has run out of the medication and she is unable to get a follow-up appointment with her primary care provider. ? ? ?The history is provided by the patient. A language interpreter was used (Spanish interpreter used).  ? ?  ? ?Home Medications ?Prior to Admission medications   ?Medication Sig Start Date End Date Taking? Authorizing Provider  ?acetaminophen (TYLENOL) 500 MG tablet Take 2 tablets (1,000 mg total) by mouth every 6 (six) hours as needed for mild pain. 07/23/20   Sheila Oats, MD  ?coconut oil OIL Apply 1 application topically as  needed. ?Patient not taking: Reported on 07/30/2020 07/23/20   Sheila Oats, MD  ?ferrous sulfate 325 (65 FE) MG tablet Take 1 tablet (325 mg total) by mouth every other day. 07/24/20   Sheila Oats, MD  ?ibuprofen (ADVIL) 800 MG tablet Take 1 tablet (800 mg total) by mouth every 8 (eight) hours. ?Patient not taking: Reported on 07/30/2020 07/23/20   Sheila Oats, MD  ?metFORMIN (GLUCOPHAGE) 500 MG tablet Take 1 tablet (500 mg total) by mouth 2 (two) times daily with a meal. 07/23/20   Sheila Oats, MD  ?Prenatal Vit-Fe Fumarate-FA (PRENATAL VITAMINS) 28-0.8 MG TABS Take 1 tablet by mouth daily. 02/23/20   Judeth Horn, NP  ?   ? ?Allergies    ?Patient has no known allergies.   ? ?Review of Systems   ?Review of Systems  ?Constitutional:  Positive for fatigue. Negative for chills, diaphoresis and fever.  ?HENT:  Positive for dental problem (left upper).   ?Eyes:  Positive for visual disturbance (blurred).  ?Respiratory:  Negative for shortness of breath.   ?Cardiovascular:  Positive for chest pain and palpitations (resolved).  ?Gastrointestinal:  Positive for nausea (resolved). Negative for vomiting.  ?Neurological:  Positive for headaches.  ?All other systems reviewed and are negative. ? ?Physical Exam ?Updated Vital Signs ?BP 112/80   Pulse 91   Temp 98.8 ?F (37.1 ?C) (Oral)   Resp 17   Ht 5\' 1"  (1.549 m)   Wt 58.1 kg   SpO2 99%   Breastfeeding Yes   BMI 24.19 kg/m?  ?  Physical Exam ?Vitals and nursing note reviewed.  ?Constitutional:   ?   General: She is not in acute distress. ?   Appearance: She is not diaphoretic.  ?HENT:  ?   Head: Normocephalic and atraumatic.  ?   Mouth/Throat:  ?   Pharynx: No oropharyngeal exudate.  ?   Comments: Dentures in place to have her teeth.  Tenderness to palpation to left upper gumline without obvious fluctuance or abscess appreciated.  Uvula midline without swelling.  Dental caries noted throughout. ?Eyes:  ?   General: No scleral icterus. ?   Conjunctiva/sclera:  Conjunctivae normal.  ?Cardiovascular:  ?   Rate and Rhythm: Regular rhythm. Tachycardia present.  ?   Pulses: Normal pulses.  ?   Heart sounds: Normal heart sounds.  ?   Comments: Tachycardic on exam ?Pulmonary:  ?   Effort: Pulmonary effort is normal. No respiratory distress.  ?   Breath sounds: Normal breath sounds. No wheezing.  ?Chest:  ?   Comments: No chest wall tenderness to palpation. ?Abdominal:  ?   General: Bowel sounds are normal.  ?   Palpations: Abdomen is soft. There is no mass.  ?   Tenderness: There is no abdominal tenderness. There is no guarding or rebound.  ?Musculoskeletal:     ?   General: Normal range of motion.  ?   Cervical back: Normal range of motion and neck supple.  ?   Comments: Strength and sensation intact to bilateral upper and lower extremities.  ?Skin: ?   General: Skin is warm and dry.  ?Neurological:  ?   General: No focal deficit present.  ?   Mental Status: She is alert.  ?   Cranial Nerves: Cranial nerves 2-12 are intact.  ?   Sensory: Sensation is intact.  ?   Motor: Motor function is intact. No pronator drift.  ?   Comments: Negative pronator drift.  Strength sensation intact to bilateral upper and lower extremities.  Grip strength 5/5 bilaterally.  Cranial nerves II-XII intact.  ?Psychiatric:     ?   Behavior: Behavior normal.  ? ? ?ED Results / Procedures / Treatments   ?Labs ?(all labs ordered are listed, but only abnormal results are displayed) ?Labs Reviewed  ?BASIC METABOLIC PANEL - Abnormal; Notable for the following components:  ?    Result Value  ? Sodium 132 (*)   ? Chloride 97 (*)   ? Glucose, Bld 279 (*)   ? All other components within normal limits  ?CBC - Abnormal; Notable for the following components:  ? Platelets 433 (*)   ? All other components within normal limits  ?SEDIMENTATION RATE  ?C-REACTIVE PROTEIN  ?D-DIMER, QUANTITATIVE (NOT AT [facility])  ?I-STAT BETA HCG BLOOD, ED ([facility], [facility], [facility] ONLY)  ?TROPONIN I (HIGH SENSITIVITY)  ?TROPONIN I (HIGH SENSITIVITY)   ? ? ?EKG ?EKG Interpretation ? ?Date/Time:  Wednesday March 01 2022 08:22:36 EDT ?Ventricular Rate:  106 ?PR Interval:  136 ?QRS Duration: 80 ?QT Interval:  326 ?QTC Calculation: 433 ?R Axis:   -59 ?Text Interpretation: Sinus tachycardia Left axis deviation Pulmonary disease pattern Abnormal ECG No previous ECGs available Confirmed by Gloris Manchester [phone]) on 03/01/2022 11:26:45 AM ? ?Radiology ?DG Chest 2 View ? ?Result Date: 03/01/2022 ?CLINICAL DATA:  Chest pain. EXAM: CHEST - 2 VIEW COMPARISON:  Chest two views 08/10/2008 FINDINGS: Cardiac silhouette and mediastinal contours are within normal limits. The lungs are clear. No pleural effusion or pneumothorax. No acute skeletal abnormality. Cholecystectomy clips.  IMPRESSION: No active cardiopulmonary disease. Electronically Signed   By: Neita Garnetonald  Viola M.D.   On: 03/01/2022 08:54  ? ?CT Head Wo Contrast ? ?Result Date: 03/01/2022 ?CLINICAL DATA:  Headache, classic migraine headache for 3 weeks EXAM: CT HEAD WITHOUT CONTRAST TECHNIQUE: Contiguous axial images were obtained from the base of the skull through the vertex without intravenous contrast. RADIATION DOSE REDUCTION: This exam was performed according to the departmental dose-optimization program which includes automated exposure control, adjustment of the mA and/or kV according to patient size and/or use of iterative reconstruction technique. COMPARISON:  2020 FINDINGS: Brain: There is no acute intracranial hemorrhage, mass effect, or edema. Gray-white differentiation is preserved. There is no extra-axial fluid collection. Ventricles and sulci are within normal limits in size and configuration. Vascular: No hyperdense vessel or unexpected calcification. Skull: Calvarium is unremarkable. Sinuses/Orbits: No acute finding. Other: None. IMPRESSION: No acute intracranial abnormality. Electronically Signed   By: Guadlupe SpanishPraneil  Patel M.D.   On: 03/01/2022 14:44   ? ?Procedures ?Procedures  ? ? ?Medications Ordered in  ED ?Medications  ?sodium chloride 0.9 % bolus 1,000 mL (1,000 mLs Intravenous New Bag/Given 03/01/22 1441)  ?acetaminophen (TYLENOL) tablet 1,000 mg (1,000 mg Oral Given 03/01/22 1441)  ? ? ?ED Course/ Medical Decision Making/ A&P

## 2022-03-01 NOTE — ED Notes (Signed)
Reviewed discharge instructions with patient and daughter. Follow-up care and medications reviewed. Patient and daughter verbalized understanding. Patient A&Ox4, VSS, and ambulatory with steady gait upon discharge.  

## 2022-03-01 NOTE — ED Notes (Signed)
D-dimer and C-reactive collected and sent to lab at this time ?

## 2022-03-01 NOTE — ED Provider Notes (Signed)
Care of the patient received from [facility].  Please see her note for full HPI. ? ?Spanish-speaking 36 year old female with a history of diabetes presented to the ER with multiple complaints including headache, jaw pain, intermittent blurry vision, nausea ? ?Her work-up overall was unremarkable,  labs reassuring, CT of the head without any acute findings.  Care of the patient signed out pending D-dimer  for PE rule out given tachycardia and SOB, sed rate and CRP to rule out possible GCA.  She was treated with fluids and Tylenol and her headache is now resolved. Ddimer normal, sed rate barely elevated, CRP normal. Will send Penicillin for possible dental infection. Given intermittent fatigue, headaches, blurry vision, will send TSH however this will not affect her hospital course as I do not suspect hyper/hypothyroid emergency.  Encouraged to follow up on results via MyChart. She plans to call her PCP and schedule an appointment. She was informed about the Metformin refill that she has and she will go pick up her prescription. We discussed return precautions. She voiced understanding and is agreeable.  Stable for discharge  ? ? ?  ?Mare Ferrari, PA-C ?03/01/22 2018 ? ?  ?Virgina Norfolk, DO ?03/02/22 0015 ? ?

## 2022-03-01 NOTE — ED Triage Notes (Signed)
Using medical interpreter- patient endorses left sided chest pain radiating into her back onset of yesterday. Reports headache and pain in jaw while eating. States when leaning forward feeling fatigue and short of breath. Patient speaking in full sentences.  ?

## 2022-04-13 ENCOUNTER — Ambulatory Visit: Payer: Self-pay | Admitting: Family Medicine

## 2022-06-08 ENCOUNTER — Emergency Department (HOSPITAL_COMMUNITY)
Admission: EM | Admit: 2022-06-08 | Discharge: 2022-06-08 | Discharge status: Against Medical Advice | Payer: Self-pay | Attending: Emergency Medicine | Admitting: Emergency Medicine

## 2022-06-08 ENCOUNTER — Other Ambulatory Visit: Payer: Self-pay

## 2022-06-08 ENCOUNTER — Emergency Department (HOSPITAL_COMMUNITY): Payer: Self-pay

## 2022-06-08 ENCOUNTER — Encounter (HOSPITAL_COMMUNITY): Payer: Self-pay

## 2022-06-08 DIAGNOSIS — R059 Cough, unspecified: Secondary | ICD-10-CM | POA: Insufficient documentation

## 2022-06-08 DIAGNOSIS — E119 Type 2 diabetes mellitus without complications: Secondary | ICD-10-CM | POA: Insufficient documentation

## 2022-06-08 DIAGNOSIS — R0789 Other chest pain: Secondary | ICD-10-CM | POA: Insufficient documentation

## 2022-06-08 DIAGNOSIS — Z5321 Procedure and treatment not carried out due to patient leaving prior to being seen by health care provider: Secondary | ICD-10-CM | POA: Insufficient documentation

## 2022-06-08 LAB — CBC
HCT: 33.8 % — ABNORMAL LOW (ref 36.0–46.0)
Hemoglobin: 12 g/dL (ref 12.0–15.0)
MCH: 29.2 pg (ref 26.0–34.0)
MCHC: 35.5 g/dL (ref 30.0–36.0)
MCV: 82.2 fL (ref 80.0–100.0)
Platelets: 450 10*3/uL — ABNORMAL HIGH (ref 150–400)
RBC: 4.11 MIL/uL (ref 3.87–5.11)
RDW: 12.4 % (ref 11.5–15.5)
WBC: 8.2 10*3/uL (ref 4.0–10.5)
nRBC: 0 % (ref 0.0–0.2)

## 2022-06-08 LAB — COMPREHENSIVE METABOLIC PANEL
ALT: 18 U/L (ref 0–44)
AST: 18 U/L (ref 15–41)
Albumin: 3.9 g/dL (ref 3.5–5.0)
Alkaline Phosphatase: 91 U/L (ref 38–126)
Anion gap: 8 (ref 5–15)
BUN: 9 mg/dL (ref 6–20)
CO2: 27 mmol/L (ref 22–32)
Calcium: 9.1 mg/dL (ref 8.9–10.3)
Chloride: 102 mmol/L (ref 98–111)
Creatinine, Ser: 0.35 mg/dL — ABNORMAL LOW (ref 0.44–1.00)
GFR, Estimated: >60 mL/min (ref >60)
Glucose, Bld: 183 mg/dL — ABNORMAL HIGH (ref 70–99)
Potassium: 3.4 mmol/L — ABNORMAL LOW (ref 3.5–5.1)
Sodium: 137 mmol/L (ref 135–145)
Total Bilirubin: 0.5 mg/dL (ref 0.3–1.2)
Total Protein: 7.4 g/dL (ref 6.5–8.1)

## 2022-06-08 LAB — TROPONIN I (HIGH SENSITIVITY)
Troponin I (High Sensitivity): 7 ng/L (ref <18)
Troponin I (High Sensitivity): 6 ng/L (ref <18)

## 2022-06-08 LAB — LIPASE, BLOOD: Lipase: 31 U/L (ref 11–51)

## 2022-06-08 NOTE — ED Provider Notes (Signed)
Emergency Department Provider Note   I have reviewed the triage vital signs and the nursing notes.   HISTORY  Chief Complaint Chest Pain  Video Spanish interpreter used for this encounter  HPI Angelica [name] is a 36 y.o. female with past history of diabetes presents to the emergency department for evaluation of chest discomfort.  She describes intermittent pain in the left chest radiating to the left shoulder blade.  She describes it as pressure but also burning at times and tightness.  No associated shortness of breath.  She notices it mainly with movement such as walking.  No particular pleuritic discomfort.  No fevers or chills.  She has had some coughing.  No other modifying factors. No leg swelling or pain.     Past Medical History:  Diagnosis Date   Abscess 01/14/2019   gluteal    Abscess and cellulitis of gluteal region 01/14/2019   DM (diabetes mellitus), type 2 (HCC)     Review of Systems  Constitutional: No fever/chills Eyes: No visual changes. ENT: No sore throat. Cardiovascular: Positive chest pain. Respiratory: Denies shortness of breath. Gastrointestinal: No abdominal pain.  No nausea, no vomiting.  No diarrhea.  No constipation. Genitourinary: Negative for dysuria. Musculoskeletal: Negative for back pain. Skin: Negative for rash. Neurological: Negative for headaches, focal weakness or numbness.  ____________________________________________   PHYSICAL EXAM:  VITAL SIGNS: ED Triage Vitals  Enc Vitals Group     BP 06/08/22 1222 106/87     Pulse Rate 06/08/22 1222 100     Resp 06/08/22 1222 14     Temp 06/08/22 1222 98.9 F (37.2 C)     Temp Source 06/08/22 1222 Oral     SpO2 06/08/22 1222 100 %   Constitutional: Alert and oriented. Well appearing and in no acute distress. Eyes: Conjunctivae are normal.  Head: Atraumatic. Nose: No congestion/rhinnorhea. Mouth/Throat: Mucous membranes are moist.   Neck: No stridor.   Cardiovascular: Normal  rate, regular rhythm. Good peripheral circulation. Grossly normal heart sounds.   Respiratory: Normal respiratory effort.  No retractions. Lungs CTAB. Gastrointestinal: Soft and nontender. No distention.  Musculoskeletal: No lower extremity tenderness nor edema. No gross deformities of extremities. Neurologic:  Normal speech and language. No gross focal neurologic deficits are appreciated.  Skin:  Skin is warm, dry and intact. No rash noted.  ____________________________________________   LABS (all labs ordered are listed, but only abnormal results are displayed)  Labs Reviewed  CBC - Abnormal; Notable for the following components:      Result Value   HCT 33.8 (*)    Platelets 450 (*)    All other components within normal limits  COMPREHENSIVE METABOLIC PANEL - Abnormal; Notable for the following components:   Potassium 3.4 (*)    Glucose, Bld 183 (*)    Creatinine, Ser 0.35 (*)    All other components within normal limits  LIPASE, BLOOD  TROPONIN I (HIGH SENSITIVITY)  TROPONIN I (HIGH SENSITIVITY)   ____________________________________________  EKG   EKG Interpretation  Date/Time:  Thursday June 08 2022 12:25:02 EDT Ventricular Rate:  99 PR Interval:  138 QRS Duration: 84 QT Interval:  342 QTC Calculation: 438 R Axis:   30 Text Interpretation: Normal sinus rhythm Cannot rule out Anterior infarct , age undetermined No significant change since last tracing When compared with ECG of 01-Mar-2022 08:22, PREVIOUS ECG IS PRESENT Confirmed by Gwyneth Sprout ([id number]) on 06/08/2022 4:31:55 PM        ____________________________________________  RADIOLOGY  No  results found.  ____________________________________________   PROCEDURES  Procedure(s) performed:   Procedures  None ____________________________________________   INITIAL IMPRESSION / ASSESSMENT AND PLAN / ED COURSE  Pertinent labs & imaging results that were available during my care of the patient  were reviewed by me and considered in my medical decision making (see chart for details).   This patient is Presenting for Evaluation of CP, which does require a range of treatment options, and is a complaint that involves a high risk of morbidity and mortality.  The Differential Diagnoses includes all life-threatening causes for chest pain. This includes but is not exclusive to acute coronary syndrome, aortic dissection, pulmonary embolism, cardiac tamponade, community-acquired pneumonia, pericarditis, musculoskeletal chest wall pain, etc.    I did obtain Additional Historical Information from daughter at bedside.  I decided to review pertinent External Data, and in summary patient with prior ED presentations for CP with last encounter in April of this year.   Clinical Laboratory Tests Ordered, included troponin negative x 2.  Lipase and LFTs normal.  No acute anemia.  D-dimer pending.  Radiologic Tests Ordered, included CXR. I independently interpreted the images and agree with radiology interpretation.   Cardiac Monitor Tracing which shows NSR.   Social Determinants of Health Risk no smoking history.   Medical Decision Making: Summary:  Patient presents to the emergency department with fairly atypical chest pain.  EKG and troponins are reassuring.  Have added D-dimer given some of the atypical nature of her discomfort.  She has had negative D-dimers in the past.  Lower suspicion overall for PE and she is relatively low risk.  Patient eloped prior to d dimer.    Disposition: eloped  ____________________________________________  FINAL CLINICAL IMPRESSION(S) / ED DIAGNOSES  Final diagnoses:  Atypical chest pain    Note:  This document was prepared using Dragon voice recognition software and may include unintentional dictation errors.  Alona Bene, MD, [facility]    Dennis Killilea, Arlyss Repress, MD 06/09/22 [phone]

## 2022-06-08 NOTE — ED Provider Triage Note (Signed)
Emergency Medicine Provider Triage Evaluation Note  Angelica [name] , a 36 y.o. female  was evaluated in triage.  Pt complains of chest pain which has been ongoing for the past 3 days.  Reports the pain is severe in nature, felt a "squeezing my heart "sensation that was very severe in nature last night.  She reports that her family told her that she "went out ", likely syncopized from this pain.  Describes the pain as squeezing radiating through her back.  Not taking any medication for improvement in symptoms.  She does have underlying diabetes but keeps a close eye on this.  Not take any medication for improvement in symptoms.  In addition, endorsing some lower abdominal pain she describes this as sharp and alleviated with holding her abdomen.  Patient is a G8, P8..  Review of Systems  Positive: Chest pain, abdominal pain Negative: Shortness of breath, nausea, vomiting, fever  Physical Exam  BP 106/87   Pulse 100   Temp 98.9 F (37.2 C) (Oral)   Resp 14   SpO2 100%  Gen:   Awake, no distress   Resp:  Normal effort MSK:   Moves extremities without difficulty  Other:    Medical Decision Making  Medically screening exam initiated at 12:29 PM.  Appropriate orders placed.  Saory Jamelle Rushing was informed that the remainder of the evaluation will be completed by another provider, this initial triage assessment does not replace that evaluation, and the importance of remaining in the ED until their evaluation is complete.     Claude Manges, PA-C 06/08/22 1233

## 2022-06-08 NOTE — ED Notes (Signed)
Pt and family member seen walking out of ER to the lobby

## 2022-06-08 NOTE — ED Triage Notes (Signed)
Pt arrived POV from home c/o CP x3 days. Pt states her family also told her she passed out yesterday night when she was having the pain. Pt also endorses some abdominal pain.

## 2022-12-20 LAB — PREGNANCY, URINE: Pregnancy Test, Urine: POSITIVE

## 2023-01-09 ENCOUNTER — Telehealth: Payer: Self-pay

## 2023-01-09 ENCOUNTER — Other Ambulatory Visit (HOSPITAL_COMMUNITY)
Admission: RE | Admit: 2023-01-09 | Discharge: 2023-01-09 | Disposition: A | Discharge status: Home / Self Care | Payer: Self-pay | Source: Ambulatory Visit | Attending: Family Medicine | Admitting: Family Medicine

## 2023-01-09 ENCOUNTER — Other Ambulatory Visit: Payer: Self-pay

## 2023-01-09 ENCOUNTER — Ambulatory Visit (INDEPENDENT_AMBULATORY_CARE_PROVIDER_SITE_OTHER): Payer: Self-pay

## 2023-01-09 VITALS — BP 95/62 | HR 81 | Wt 112.1 lb

## 2023-01-09 DIAGNOSIS — O0990 Supervision of high risk pregnancy, unspecified, unspecified trimester: Secondary | ICD-10-CM | POA: Insufficient documentation

## 2023-01-09 DIAGNOSIS — Z3A18 18 weeks gestation of pregnancy: Secondary | ICD-10-CM

## 2023-01-09 DIAGNOSIS — O0992 Supervision of high risk pregnancy, unspecified, second trimester: Secondary | ICD-10-CM

## 2023-01-09 DIAGNOSIS — O09522 Supervision of elderly multigravida, second trimester: Secondary | ICD-10-CM | POA: Insufficient documentation

## 2023-01-09 NOTE — Telephone Encounter (Signed)
Called Pt using [place] id# [phone] to inform her of her [facility] [place] appointment that's scheduled on [facility]23@ [facility]. Pt verbalized understanding.

## 2023-01-09 NOTE — Progress Notes (Signed)
New OB Intake  I connected with Angelica [name]  on 01/09/23 at 10:15 AM EST by In Person Visit and verified that I am speaking with the correct person using two identifiers. Nurse is located at [facility] and pt is located at [facility].  I discussed the limitations, risks, security and privacy concerns of performing an evaluation and management service by telephone and the availability of in person appointments. I also discussed with the patient that there may be a patient responsible charge related to this service. The patient expressed understanding and agreed to proceed.  I explained I am completing New OB Intake today. We discussed EDD of 06/09/23 that is based on LMP of 09/02/22. Pt is G9/P8. I reviewed her allergies, medications, Medical/Surgical/OB history, and appropriate screenings. I informed her of [facility] services. [facility] information placed in AVS. Based on history, this is a high risk pregnancy.  Patient Active Problem List   Diagnosis Date Noted   Supervision of high risk pregnancy, antepartum 01/09/2023   AMA (advanced maternal age) multigravida 35+, second trimester 01/09/2023   Cesarean delivery delivered 07/23/2020   Acute blood loss anemia 07/23/2020   IUP (intrauterine pregnancy), incidental 07/20/2020   Excessive fetal growth affecting management of mother in third trimester, antepartum 06/30/2020   Grand multipara 06/23/2020   Pre-existing type 2 diabetes mellitus during pregnancy 03/03/2020   Illiterate 03/03/2020   History of VBAC 02/23/2020   Language barrier 02/23/2020   Diabetes mellitus type 2, uncontrolled, without complications [id number]    Concerns addressed today  Delivery Plans Plans to deliver at [facility]. Patient given information for [facility] Healthy Baby website for more information about [facility] and [place]. Patient is not interested in water birth. Offered upcoming OB visit with CNM to discuss further.  MyChart/Babyscripts MyChart access verified.  I explained pt will have some visits in office and some virtually. Babyscripts instructions given and order placed. Patient verifies receipt of registration text/e-mail. Account successfully created and app downloaded.  Blood Pressure Cuff/Weight Scale Patient is self-pay; explained patient will be given BP cuff at first prenatal appt. Explained after first prenatal appt pt will check weekly and document in 52. Patient does have weight scale.  Anatomy US Explained first scheduled [facility] [place] will be around 19 weeks. Anatomy US scheduled for 01/25/23 at 10:00a @ [facility].  Labs Discussed Johnsie Cancel genetic screening with patient. Would like both Panorama and Horizon drawn at new OB visit. Routine prenatal labs needed.  COVID Vaccine Patient has not had COVID vaccine.   Is patient a CenteringPregnancy candidate?  Not a Candidate  Not a candidate due to DM     Is patient a Mom+Baby Combined Care candidate?  Not a candidate     Social Determinants of Health Food Insecurity: Patient denies food insecurity. WIC Referral: Patient is interested in referral to [facility].  Transportation: Patient denies transportation needs. Childcare: Discussed no children allowed at ultrasound appointments. Offered childcare services; patient declines childcare services at this time.  Interested in [place]? If yes, send referral.   First visit review I reviewed new OB appt with patient. Explained pt will be seen by Dr.Bass at first visit; encounter routed to appropriate provider. Explained that patient will be seen by pregnancy navigator following visit with provider.   Bethanne Ginger, Jane Lew 01/09/2023  12:34 PM

## 2023-01-10 LAB — CBC/D/PLT+RPR+RH+ABO+RUBIGG...
Antibody Screen: NEGATIVE
Basophils Absolute: 0 10*3/uL (ref 0.0–0.2)
Basos: 0 %
EOS (ABSOLUTE): 0.4 10*3/uL (ref 0.0–0.4)
Eos: 4 %
HCV Ab: NONREACTIVE
HIV Screen 4th Generation wRfx: NONREACTIVE
Hematocrit: 33.4 % — ABNORMAL LOW (ref 34.0–46.6)
Hemoglobin: 11 g/dL — ABNORMAL LOW (ref 11.1–15.9)
Hepatitis B Surface Ag: NEGATIVE
Immature Grans (Abs): 0 10*3/uL (ref 0.0–0.1)
Immature Granulocytes: 0 %
Lymphocytes Absolute: 2.7 10*3/uL (ref 0.7–3.1)
Lymphs: 27 %
MCH: 28.2 pg (ref 26.6–33.0)
MCHC: 32.9 g/dL (ref 31.5–35.7)
MCV: 86 fL (ref 79–97)
Monocytes Absolute: 0.5 10*3/uL (ref 0.1–0.9)
Monocytes: 5 %
Neutrophils Absolute: 6.3 10*3/uL (ref 1.4–7.0)
Neutrophils: 64 %
Platelets: 590 10*3/uL — ABNORMAL HIGH (ref 150–450)
RBC: 3.9 x10E6/uL (ref 3.77–5.28)
RDW: 12.6 % (ref 11.7–15.4)
RPR Ser Ql: NONREACTIVE
Rh Factor: POSITIVE
Rubella Antibodies, IGG: 5.2 index (ref >0.99)
WBC: 9.9 10*3/uL (ref 3.4–10.8)

## 2023-01-10 LAB — HEMOGLOBIN A1C
Est. average glucose Bld gHb Est-mCnc: 137 mg/dL
Hgb A1c MFr Bld: 6.4 % — ABNORMAL HIGH (ref 4.8–5.6)

## 2023-01-10 LAB — COMPREHENSIVE METABOLIC PANEL
ALT: 13 IU/L (ref 0–32)
AST: 16 IU/L (ref 0–40)
Albumin/Globulin Ratio: 1.2 (ref 1.2–2.2)
Albumin: 3.9 g/dL (ref 3.9–4.9)
Alkaline Phosphatase: 93 IU/L (ref 44–121)
BUN/Creatinine Ratio: 19 (ref 9–23)
BUN: 8 mg/dL (ref 6–20)
Bilirubin Total: 0.2 mg/dL (ref 0.0–1.2)
CO2: 19 mmol/L — ABNORMAL LOW (ref 20–29)
Calcium: 9.5 mg/dL (ref 8.7–10.2)
Chloride: 99 mmol/L (ref 96–106)
Creatinine, Ser: 0.42 mg/dL — ABNORMAL LOW (ref 0.57–1.00)
Globulin, Total: 3.3 g/dL (ref 1.5–4.5)
Glucose: 86 mg/dL (ref 70–99)
Potassium: 4.2 mmol/L (ref 3.5–5.2)
Sodium: 134 mmol/L (ref 134–144)
Total Protein: 7.2 g/dL (ref 6.0–8.5)
eGFR: 129 mL/min/{1.73_m2} (ref >59)

## 2023-01-10 LAB — PROTEIN / CREATININE RATIO, URINE
Creatinine, Urine: 35.1 mg/dL
Protein, Ur: 5.4 mg/dL
Protein/Creat Ratio: 154 mg/g creat (ref 0–200)

## 2023-01-10 LAB — HCV INTERPRETATION

## 2023-01-10 LAB — GC/CHLAMYDIA PROBE AMP (~~LOC~~) NOT AT ARMC
Chlamydia: NEGATIVE
Comment: NEGATIVE
Comment: NORMAL
Neisseria Gonorrhea: NEGATIVE

## 2023-01-10 LAB — TSH: TSH: 2.18 u[IU]/mL (ref 0.450–4.500)

## 2023-01-11 LAB — CULTURE, OB URINE

## 2023-01-11 LAB — URINE CULTURE, OB REFLEX

## 2023-01-15 NOTE — Addendum Note (Signed)
Addended by: Louisa Second E on: 01/15/2023 12:15 PM   Modules accepted: Orders

## 2023-01-16 ENCOUNTER — Ambulatory Visit (INDEPENDENT_AMBULATORY_CARE_PROVIDER_SITE_OTHER): Payer: Self-pay | Admitting: Obstetrics and Gynecology

## 2023-01-16 ENCOUNTER — Encounter: Payer: Self-pay | Attending: Obstetrics and Gynecology | Admitting: Registered"

## 2023-01-16 ENCOUNTER — Other Ambulatory Visit (HOSPITAL_COMMUNITY)
Admission: RE | Admit: 2023-01-16 | Discharge: 2023-01-16 | Disposition: A | Discharge status: Home / Self Care | Payer: Self-pay | Source: Ambulatory Visit | Attending: Obstetrics and Gynecology | Admitting: Obstetrics and Gynecology

## 2023-01-16 ENCOUNTER — Ambulatory Visit (INDEPENDENT_AMBULATORY_CARE_PROVIDER_SITE_OTHER): Payer: Self-pay | Admitting: Registered"

## 2023-01-16 VITALS — BP 89/57 | HR 96 | Wt 116.0 lb

## 2023-01-16 DIAGNOSIS — Z98891 History of uterine scar from previous surgery: Secondary | ICD-10-CM

## 2023-01-16 DIAGNOSIS — O24112 Pre-existing diabetes mellitus, type 2, in pregnancy, second trimester: Secondary | ICD-10-CM | POA: Insufficient documentation

## 2023-01-16 DIAGNOSIS — Z789 Other specified health status: Secondary | ICD-10-CM

## 2023-01-16 DIAGNOSIS — O0932 Supervision of pregnancy with insufficient antenatal care, second trimester: Secondary | ICD-10-CM | POA: Insufficient documentation

## 2023-01-16 DIAGNOSIS — O0990 Supervision of high risk pregnancy, unspecified, unspecified trimester: Secondary | ICD-10-CM | POA: Insufficient documentation

## 2023-01-16 DIAGNOSIS — Z3A19 19 weeks gestation of pregnancy: Secondary | ICD-10-CM

## 2023-01-16 DIAGNOSIS — Z713 Dietary counseling and surveillance: Secondary | ICD-10-CM | POA: Insufficient documentation

## 2023-01-16 DIAGNOSIS — O09522 Supervision of elderly multigravida, second trimester: Secondary | ICD-10-CM

## 2023-01-16 DIAGNOSIS — Z7984 Long term (current) use of oral hypoglycemic drugs: Secondary | ICD-10-CM | POA: Insufficient documentation

## 2023-01-16 DIAGNOSIS — O0992 Supervision of high risk pregnancy, unspecified, second trimester: Secondary | ICD-10-CM | POA: Insufficient documentation

## 2023-01-16 LAB — PANORAMA PRENATAL TEST FULL PANEL:PANORAMA TEST PLUS 5 ADDITIONAL MICRODELETIONS: FETAL FRACTION: 7.1

## 2023-01-16 NOTE — Progress Notes (Signed)
INITIAL PRENATAL VISIT NOTE  Subjective:  Angelica [name] is a 37 y.o. [id number] at 58w3dby LMP being seen today for her initial prenatal visit.  She has an obstetric history significant for c/s x 2 and VBAC x 5.  The second c section involved a J incision. She has a medical history significant for type 2 diabetes.  Patient reports no complaints.  Contractions: Not present. Vag. Bleeding: None.  Movement: Absent. Denies leaking of fluid.    Past Medical History:  Diagnosis Date   Abscess 01/14/2019   gluteal    Abscess and cellulitis of gluteal region 01/14/2019   DM (diabetes mellitus), type 2 ([facility]     Past Surgical History:  Procedure Laterality Date   CESAREAN SECTION     CESAREAN SECTION N/A 07/20/2020   Procedure: CESAREAN SECTION;  Surgeon: DSloan Leiter MD;  Location: [facility] LD ORS;  Service: Obstetrics;  Laterality: N/A;   CHOLECYSTECTOMY      OB History  Gravida Para Term Preterm AB Living  '9 8 8     8  '$ SAB IAB Ectopic Multiple Live Births        0 8    # Outcome Date GA Lbr Len/2nd Weight Sex Delivery Anes PTL Lv  9 Current           8 Term 07/20/20 352w0d8 lb 1.2 oz (3.663 kg) M CS-LTranv Spinal  LIV  7 Term 12/21/16 4032w0d lb 8 oz (3.856 kg) F Vag-Spont   LIV     Birth Comments: wnl  6 Term 08/17/14 40w79w0dlb 8 oz (3.856 kg) F Vag-Spont   LIV     Birth Comments: wnl  5 Term 10/25/09 40w070w0db (3.175 kg) F Vag-Spont   LIV     Birth Comments: wnl  4 Term 05/20/07 72w0d75w0d 8 oz (3.402 kg) F Vag-Spont   LIV     Birth Comments: wnl  3 Term 01/13/05 [redacted]w[redacted]d 19w0d8 oz (3.402 kg)  VBAC   LIV     Birth Comments: wnl  2 Term 12/07/02 [redacted]w[redacted]d  45w0d3.629 kg) M CS-Unspec Gen  LIV     Birth Comments: c/s - not sure why  1 Term 09/2000 [redacted]w[redacted]d  512w0d.268 kg)  Vag-Spont None  LIV     Birth Comments: wnl    Social History   Socioeconomic History   Marital status: Married    Spouse name: Not on [name]   Number of children: Not on [name]   Years of education:  Not on [name]   Highest education level: Not on [name]  Occupational History   Not on [name]  Tobacco Use   Smoking status: Never   Smokeless tobacco: Never  Vaping Use   Vaping Use: Never used  Substance and Sexual Activity   Alcohol use: Never   Drug use: Never   Sexual activity: Yes    Birth control/protection: None    Comment: withdrawal  Other Topics Concern   Not on [name]  Social History Narrative   Not on [name]   Social Determinants of Health   Financial Resource Strain: Not on [name]  Food Insecurity: Food Insecurity Present (01/16/2023)   Hunger Vital Sign    Worried About Running Out of Food in the Last Year: Sometimes true    Ran Out of Food in the Last Year: Sometimes true  Transportation Needs: No Transportation Needs (01/16/2023)  PRAPARE - Hydrologist (Medical): No    Lack of Transportation (Non-Medical): No  Physical Activity: Not on [name]  Stress: Not on [name]  Social Connections: Not on [name]    Family History  Problem Relation Age of Onset   Diabetes Mother      Current Outpatient Medications:    metFORMIN (GLUCOPHAGE) 500 MG tablet, Take 1 tablet (500 mg total) by mouth 2 (two) times daily with a meal., Disp: 60 tablet, Rfl: 1   Prenatal Vit-Fe Fumarate-FA (PRENATAL VITAMINS) 28-0.8 MG TABS, Take 1 tablet by mouth daily., Disp: 30 tablet, Rfl: 9  No Known Allergies  Review of Systems: Negative except for what is mentioned in HPI.  Objective:   Vitals:   01/16/23 0928  BP: (!) 89/57  Pulse: 96  Weight: 116 lb (52.6 kg)    Fetal Status: Fetal Heart Rate (bpm): 144 Fundal Height: 19 cm Movement: Absent     Physical Exam: BP (!) 89/57   Pulse 96   Wt 116 lb (52.6 kg)   LMP 09/02/2022   BMI 21.92 kg/m  CONSTITUTIONAL: Well-developed, well-nourished female in no acute distress.  NEUROLOGIC: Alert and oriented to person, place, and time. Normal reflexes, muscle tone coordination. No cranial nerve deficit  noted. PSYCHIATRIC: Normal mood and affect. Normal behavior. Normal judgment and thought content. SKIN: Skin is warm and dry. No rash noted. Not diaphoretic. No erythema. No pallor. HENT:  Normocephalic, atraumatic, External right and left ear normal. Oropharynx is clear and moist EYES: Conjunctivae and EOM are normal.  NECK: Normal range of motion, supple, no masses CARDIOVASCULAR: Normal heart rate noted, regular rhythm RESPIRATORY: Effort and breath sounds normal, no problems with respiration noted BREASTS:deferred ABDOMEN: Soft, nontender, nondistended, gravid. GU: normal appearing external female genitalia, multiparous, normal appearing cervix, scant white discharge in vagina, no lesions noted, pap taken with chaperone present Bimanual: 19 weeks sized uterus, no adnexal tenderness or palpable lesions noted MUSCULOSKELETAL: Normal range of motion. EXT:  No edema and no tenderness. 2+ distal pulses.   Assessment and Plan:  Pregnancy: [id number] at 49w3dby LMP  1. [redacted] weeks gestation of pregnancy   2. Pre-existing type 2 diabetes mellitus during pregnancy in second trimester Pt currently taking metformin, has diabetic counseling today Will reassess blood sugars at next visit.  Last A1c was 6.4  3. Supervision of high risk pregnancy, antepartum Continue routine prenatal care - Cytology - PAP  4. Language barrier Live interpreter present  5. History of VBAC Pt has had 5 VBAC, she has had 2 c sections, normally this would allow for TOLAC, but second c section had a J incision, MD currently awaiting consensus regarding TOLAC  6. AMA (advanced maternal age) multigravida 369+ second trimester   7. Late prenatal care affecting pregnancy in second trimester    Preterm labor symptoms and general obstetric precautions including but not limited to vaginal bleeding, contractions, leaking of fluid and fetal movement were reviewed in detail with the patient.  Please refer to After Visit  Summary for other counseling recommendations.   Return in about 3 weeks (around 02/06/2023) [facility].  LGriffin Basil3/03/2023 9:44 AM

## 2023-01-16 NOTE — Progress Notes (Signed)
Patient was seen for T2 Diabetes in pregnancy self-management on 01/16/23  Start time 1120 and End time 1219   Estimated due date: 06/09/23; [redacted]w[redacted]d [employer] Video Interpreter  RFuller Song#[id number] Clinical: Medications: metformin 500 mg bid  Medical History: Pt states was diagnosed with T2D 9.6% 2 yrs ago Labs: OGTT n/a, A1c 6.4%   Dietary and Lifestyle History: Pt states MD told her to take Metformin just one time per day. Pt is taking at 5 pm with dinner.  Pt states she was taking metformin prior to pregnancy. Pt was not able to provide details of what may have helped her blood sugar improve over the last 2 years (9.6% 2 yrs ago, now 6.4%)  Pt states members of her family and in her community have been giving her advice of what she cannot eat, has been avoiding rice, fruit, nuts, and beets. Pt states she eats yuca.  Pt states she bought a meter at [employer] her daughter has been checking her blood sugar for her due to needle phobia.   Physical Activity: not assessed Stress: not assessed Sleep: not assessed  24 hr Recall: did not complete recall First Meal: salad Snack: Second meal: soup with beans, rice (2 spoon fulls) Snack: Third meal: Snack: Beverages: water, cucumber water with lemon and oats  NUTRITION INTERVENTION  Nutrition education (E-1) on the following topics:   Initial Follow-up  '[x]'$  '[]'$  Definition of Gestational Diabetes '[]'$  '[]'$  Why dietary management is important in controlling blood glucose '[x]'$  '[]'$  Effects each nutrient has on blood glucose levels '[]'$  '[]'$  Simple carbohydrates vs complex carbohydrates '[]'$  '[]'$  Fluid intake '[x]'$  '[]'$  Creating a balanced meal plan '[]'$  '[]'$  Carbohydrate counting  '[x]'$  '[]'$  When to check blood glucose levels '[x]'$  '[]'$  Proper blood glucose monitoring techniques '[x]'$  '[]'$  Effect of stress and stress reduction techniques  '[x]'$  '[]'$  Exercise effect on blood glucose levels, appropriate exercise during pregnancy '[]'$  '[]'$  Importance of limiting caffeine and abstaining  from alcohol and smoking '[]'$  '[]'$  Medications used for blood sugar control during pregnancy '[]'$  '[]'$  Hypoglycemia and rule of 15 '[]'$  '[]'$  Postpartum self care  Blood glucose monitor given: Prodigy Lot # [id number] # [id number] (strips) Exp: 2024-05-03 (strips) CBG: 111 mg/dL  Patient instructed to monitor glucose levels: FBS: 60 - ? 95 mg/dL; 2 hour: ? 120 mg/dL  Patient received handouts: Nutrition Diabetes and Pregnancy Carbohydrate Counting List  Patient will be seen for follow-up in 1 week or as needed.

## 2023-01-17 LAB — CYTOLOGY - PAP
Chlamydia: NEGATIVE
Comment: NEGATIVE
Comment: NORMAL
Diagnosis: NEGATIVE
Neisseria Gonorrhea: NEGATIVE

## 2023-01-30 ENCOUNTER — Ambulatory Visit (INDEPENDENT_AMBULATORY_CARE_PROVIDER_SITE_OTHER): Payer: Self-pay | Admitting: Registered"

## 2023-01-30 ENCOUNTER — Other Ambulatory Visit: Payer: Self-pay

## 2023-01-30 ENCOUNTER — Encounter: Payer: Self-pay | Attending: Obstetrics and Gynecology | Admitting: Registered"

## 2023-01-30 VITALS — Wt 118.4 lb

## 2023-01-30 DIAGNOSIS — Z713 Dietary counseling and surveillance: Secondary | ICD-10-CM | POA: Insufficient documentation

## 2023-01-30 DIAGNOSIS — Z7984 Long term (current) use of oral hypoglycemic drugs: Secondary | ICD-10-CM | POA: Insufficient documentation

## 2023-01-30 DIAGNOSIS — Z3A21 21 weeks gestation of pregnancy: Secondary | ICD-10-CM

## 2023-01-30 DIAGNOSIS — O24112 Pre-existing diabetes mellitus, type 2, in pregnancy, second trimester: Secondary | ICD-10-CM | POA: Insufficient documentation

## 2023-01-30 DIAGNOSIS — O0992 Supervision of high risk pregnancy, unspecified, second trimester: Secondary | ICD-10-CM | POA: Insufficient documentation

## 2023-01-30 NOTE — Progress Notes (Signed)
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 The pattern of readings appears she is not really checking blood sugar. Pt did not bring meter with her. Pt was asked to bring meter to next Ob visit.   Dietary and Lifestyle History: Pt states at her [place] before starting care at [facility] she was told that she needs to start taking aspirin and insulin.   Pt reports last pregnancy she used insulin. (May not have been interpreted accurately).  Pt states she doesn't eat the same as her daughters, they cook rice, spaghetti and sausage, but food doesn't always set well with her.   Pt reports she eats a variety of vegetables including chayote, lettuce, cabbage, cucumber, tomato and onions.  Pt states she makes coffee with mostly water, not "thick" and does not add  milk or sugar.  When trying to discuss importance of eating enough for pregnancy, pt appeared defensive, states she with other pregnancies has reduced intake due to nausea but her babies have been healthy and weighed 8-9 lbs. Did not continue education about appropriate dietary intake, weight of babies, and diabetes and how they are all related. Pt may not be able to understand these concepts. May try to explain in future visit.  Pt states she has a lot of appointments for both herself and her children. We will wait to decide if she needs to have follow-up with diabetes educator until  after she sees the doctor.   Physical Activity: not assessed Stress: not assessed Sleep: not assessed  24 hr Recall:  First Meal: chayote, egg, water Snack: Second meal: a white root shape at beet Snack: a little bit of melon Third meal: yucca Snack: Beverages: water, oat water,   NUTRITION INTERVENTION  Nutrition education (E-1) on the following topics:   Initial Follow-up  [x]  []  Definition of Gestational Diabetes []  []  Why dietary management is important in controlling blood glucose [x]  []  Effects each nutrient has on blood glucose levels []  []  Simple carbohydrates vs complex carbohydrates []  []  Fluid intake [x]  [x]  Creating a balanced meal plan (eating a variety of foods and enough food for pregnancy) []  []  Carbohydrate counting  [x]  []  When to check blood glucose levels [x]  []  Proper blood glucose monitoring techniques [x]  []  Effect of stress and stress reduction techniques  [x]  []  Exercise effect on blood glucose levels, appropriate exercise during pregnancy []  [x]  Importance of limiting caffeine and abstaining from alcohol and smoking []  []  Medications used for blood sugar control during pregnancy []  []  Hypoglycemia and rule of 15 []  []  Postpartum self care   Patient instructed to monitor glucose levels: FBS: 60 - ? 95 mg/dL; 2 hour: ? 120 mg/dL  Patient received handouts: glucose log sheet & CBG supplies  Patient will be seen for follow-up as needed.

## 2023-02-13 ENCOUNTER — Other Ambulatory Visit: Payer: Self-pay

## 2023-02-13 ENCOUNTER — Ambulatory Visit (INDEPENDENT_AMBULATORY_CARE_PROVIDER_SITE_OTHER): Payer: Self-pay | Admitting: Family Medicine

## 2023-02-13 ENCOUNTER — Encounter: Payer: Self-pay | Admitting: Family Medicine

## 2023-02-13 VITALS — BP 92/60 | HR 80 | Wt 118.7 lb

## 2023-02-13 DIAGNOSIS — O0990 Supervision of high risk pregnancy, unspecified, unspecified trimester: Secondary | ICD-10-CM

## 2023-02-13 DIAGNOSIS — O09522 Supervision of elderly multigravida, second trimester: Secondary | ICD-10-CM

## 2023-02-13 DIAGNOSIS — O0992 Supervision of high risk pregnancy, unspecified, second trimester: Secondary | ICD-10-CM

## 2023-02-13 DIAGNOSIS — O24112 Pre-existing diabetes mellitus, type 2, in pregnancy, second trimester: Secondary | ICD-10-CM

## 2023-02-13 DIAGNOSIS — Z3A23 23 weeks gestation of pregnancy: Secondary | ICD-10-CM

## 2023-02-13 DIAGNOSIS — Z98891 History of uterine scar from previous surgery: Secondary | ICD-10-CM

## 2023-02-13 DIAGNOSIS — Z758 Other problems related to medical facilities and other health care: Secondary | ICD-10-CM

## 2023-02-13 DIAGNOSIS — Z603 Acculturation difficulty: Secondary | ICD-10-CM

## 2023-02-13 DIAGNOSIS — Z641 Problems related to multiparity: Secondary | ICD-10-CM

## 2023-02-13 MED ORDER — ASPIRIN 81 MG PO TBEC: 81.0000 mg | DELAYED_RELEASE_TABLET | Freq: Every day | ORAL | 12 refills | Status: DC

## 2023-02-13 NOTE — Progress Notes (Signed)
   Subjective:  Angelica [name] is a 37 y.o. [phone] at [redacted]w[redacted]d being seen today for ongoing prenatal care.  She is currently monitored for the following issues for this high-risk pregnancy and has Diabetes mellitus type 2, uncontrolled, without complications; History of VBAC; Language barrier; Pre-existing type 2 diabetes mellitus during pregnancy; Illiterate; [place] multipara; Cesarean delivery delivered; Supervision of high risk pregnancy, antepartum; and AMA (advanced maternal age) multigravida 35+, second trimester on their problem list.  Patient reports no complaints.  Contractions: Not present. Vag. Bleeding: None.  Movement: Present. Denies leaking of fluid.   The following portions of the patient's history were reviewed and updated as appropriate: allergies, current medications, past family history, past medical history, past social history, past surgical history and problem list. Problem list updated.  Objective:   Vitals:   02/13/23 1521  BP: 92/60  Pulse: 80  Weight: 118 lb 11.2 oz (53.8 kg)    Fetal Status: Fetal Heart Rate (bpm): 146   Movement: Present     General:  Alert, oriented and cooperative. Patient is in no acute distress.  Skin: Skin is warm and dry. No rash noted.   Cardiovascular: Normal heart rate noted  Respiratory: Normal respiratory effort, no problems with respiration noted  Abdomen: Soft, gravid, appropriate for gestational age. Pain/Pressure: Present     Pelvic: Vag. Bleeding: None     Cervical exam deferred        Extremities: Normal range of motion.  Edema: None  Mental Status: Normal mood and affect. Normal behavior. Normal judgment and thought content.   Urinalysis:      Assessment and Plan:  Pregnancy: [id number] at [redacted]w[redacted]d  1. Supervision of high risk pregnancy, antepartum BP and FHR normal  2. Pre-existing type 2 diabetes mellitus during pregnancy in second trimester Currently on metformin 500 mg BID Forgot log at home On recall fastings are  90-95, two post prandials are in similar range No change to meds at this point Growth scan completed (on 01/25/23 per patient) but we do not yet have the records from [place] Needs fetal echo, we will coordinate Antenatal testing to start at 32 weeks per MFM algorithm  3. AMA (advanced maternal age) multigravida 44+, second trimester Not on ASA, she is amenable to start, rx sent to pharmacy  4. Cesarean delivery delivered Para 8, 2nd delivery PCS for NRFHT, 8th delivery due to oblique presentation s/p failed ECV Prior op note reviewed, J incision up L side of uterus Schedule for RCS+BTL at 37 wks at next visit  5. [place] multipara   6. History of VBAC See above  7. Language barrier spanish  Preterm labor symptoms and general obstetric precautions including but not limited to vaginal bleeding, contractions, leaking of fluid and fetal movement were reviewed in detail with the patient. Please refer to After Visit Summary for other counseling recommendations.  Return in 4 weeks (on 03/13/2023) for [facility], ob visit, needs MD.   Clarnce Flock, MD

## 2023-02-13 NOTE — Patient Instructions (Signed)
Eleccin del mtodo anticonceptivo Contraception Choices La anticoncepcin, o los mtodos anticonceptivos, hace referencia a los mtodos o dispositivos que evitan el embarazo. Mtodos hormonales  Implante anticonceptivo Un implante anticonceptivo consiste en un tubo delgado de plstico que contiene una hormona que evita el embarazo. Es diferente de un dispositivo intrauterino (DIU). Un mdico lo inserta en la parte superior del brazo. Los implantes pueden ser eficaces durante un mximo de 3 aos. Inyecciones de progestina sola Las inyecciones de progestina sola contienen progestina, una forma sinttica de la hormona progesterona. Un mdico las administra cada 3 meses. Pldoras anticonceptivas Las pldoras anticonceptivas son pastillas que contienen hormonas que evitan el embarazo. Deben tomarse una vez al da, preferentemente a la misma hora cada da. Se necesita una receta para utilizar este mtodo anticonceptivo. Parche anticonceptivo El parche anticonceptivo contiene hormonas que evitan el embarazo. Se coloca en la piel, debe cambiarse una vez a la semana durante tres semanas y debe retirarse en la cuarta semana. Se necesita una receta para utilizar este mtodo anticonceptivo. Anillo vaginal Un anillo vaginal contiene hormonas que evitan el embarazo. Se coloca en la vagina durante tres semanas y se retira en la cuarta semana. Luego se repite el proceso con un anillo nuevo. Se necesita una receta para utilizar este mtodo anticonceptivo. Anticonceptivo de emergencia Los anticonceptivos de emergencia son mtodos para evitar un embarazo despus de tener sexo sin proteccin. Vienen en forma de pldora y pueden tomarse hasta 5 das despus de tener sexo. Funcionan mejor cuando se toman lo ms pronto posible luego de tener sexo. La mayora de los anticonceptivos de emergencia estn disponibles sin receta mdica. Este mtodo no debe utilizarse como el nico mtodo anticonceptivo. Mtodos de  barrera  Condn masculino Un condn masculino es una vaina delgada que se coloca sobre el pene durante el sexo. Los condones evitan que el esperma ingrese en el cuerpo de la mujer. Pueden utilizarse con un una sustancia que mata a los espermatozoides (espermicida) para aumentar la efectividad. Deben desecharse despus de un uso. Condn femenino Un condn femenino es una vaina blanda y holgada que se coloca en la vagina antes de tener sexo. El condn evita que el esperma ingrese en el cuerpo de la mujer. Deben desecharse despus de un uso. Diafragma Un diafragma es una barrera blanda con forma de cpula. Se inserta en la vagina antes del sexo, junto con un espermicida. El diafragma bloquea el ingreso de esperma en el tero, y el espermicida mata a los espermatozoides. El diafragma debe permanecer en la vagina durante 6 a 8 horas despus de tener sexo y debe retirarse en el plazo de las 24 horas. Un diafragma es recetado y colocado por un mdico. Debe reemplazarse cada 1 a 2 aos, despus de dar a luz, de aumentar ms de 15lb (6.8kg) y de una ciruga plvica. Capuchn cervical Un capuchn cervical es una copa redonda y blanda de ltex o plstico que se coloca en el cuello uterino. Se inserta en la vagina antes del sexo, junto con un espermicida. Bloquea el ingreso del esperma en el tero. El capuchn debe permanecer en el lugar durante 6 a 8 horas despus de tener sexo y debe retirarse en el plazo de las 48 horas. Un capuchn cervical debe ser recetado y colocado por un mdico. Debe reemplazarse cada 2aos. Esponja Una esponja es una pieza blanda y circular de espuma de poliuretano que contiene espermicida. La esponja ayuda a bloquear el ingreso de esperma en el tero, y el espermicida   mata a los espermatozoides. Para utilizarla, debe humedecerla e insertarla en la vagina. Debe insertarse antes de tener sexo, debe permanecer dentro al menos durante 6 horas despus de tener sexo y debe retirarse y  desecharse en el plazo de las 30 horas. Espermicidas Los espermicidas son sustancias qumicas que matan o bloquean al esperma y no lo dejan ingresar al cuello uterino y al tero. Vienen en forma de crema, gel, supositorio, espuma o comprimido. Un espermicida debe insertarse en la vagina con un aplicador al menos 10 o 15 minutos antes de tener sexo para dar tiempo a que surta efecto. El proceso debe repetirse cada vez que tenga sexo. Los espermicidas no requieren receta mdica. Anticonceptivos intrauterinos Dispositivo intrauterino (DIU) Un DIU es un dispositivo en forma de T que se coloca en el tero. Existen dos tipos: DIU hormonal.Este tipo contiene progestina, una forma sinttica de la hormona progesterona. Este tipo puede permanecer colocado durante 3 a 5 aos. DIU de cobre.Este tipo est recubierto con un alambre de cobre. Puede permanecer colocado durante 10 aos. Mtodos anticonceptivos permanentes Ligadura de trompas en la mujer En este mtodo, se sellan, atan u obstruyen las trompas de Falopio durante una ciruga para evitar que el vulo descienda hacia el tero. Esterilizacin histeroscpica En este mtodo, se coloca un implante pequeo y flexible dentro de cada trompa de Falopio. Los implantes hacen que se forme un tejido cicatricial en las trompas de Falopio y que las obstruya para que el espermatozoide no pueda llegar al vulo. El procedimiento demora alrededor de 3 meses para que sea efectivo. Debe utilizarse otro mtodo anticonceptivo durante esos 3 meses. Esterilizacin masculina Este es un procedimiento que consiste en atar los conductos que transportan el esperma (vasectoma). Luego del procedimiento, el hombre puede eyacular lquido (semen). Debe utilizarse otro mtodo anticonceptivo durante 3 meses despus del procedimiento. Mtodos de planificacin natural Planificacin familiar natural En este mtodo, la pareja no tiene sexo durante los das en que la mujer podra quedar  embarazada. Mtodo calendario En este mtodo, la mujer realiza un seguimiento de la duracin de cada ciclo menstrual, identifica los das en los que se puede producir un embarazo y no tiene sexo durante esos das. Mtodo de la ovulacin En este mtodo, la pareja evita tener sexo durante la ovulacin. Mtodo sintotrmico Este mtodo implica no tener sexo durante la ovulacin. Normalmente, la mujer comprueba la ovulacin al observar cambios en su temperatura y en la consistencia del moco cervical. Mtodo posovulacin En este mtodo, la pareja espera a que finalice la ovulacin para tener sexo. Dnde buscar ms informacin Centers for Disease Control and Prevention (Centros para el Control y la Prevencin de Enfermedades): www.cdc.gov Resumen La anticoncepcin, o los mtodos anticonceptivos, hace referencia a los mtodos o dispositivos que evitan el embarazo. Los mtodos anticonceptivos hormonales incluyen implantes, inyecciones, pastillas, parches, anillos vaginales y anticonceptivos de emergencia. Los mtodos anticonceptivos de barrera pueden incluir condones masculinos, condones femeninos, diafragmas, capuchones cervicales, esponjas y espermicidas. Existen dos tipos de DIU (dispositivo intrauterino). Un DIU puede colocarse en el tero de una mujer para evitar el embarazo durante 3 a 5 aos. La esterilizacin permanente puede realizarse mediante un procedimiento tanto en los hombres como en las mujeres. Los mtodos de planificacin familiar natural implican no tener sexo durante los das en que la mujer podra quedar embarazada. Esta informacin no tiene como fin reemplazar el consejo del mdico. Asegrese de hacerle al mdico cualquier pregunta que tenga. Document Revised: 06/01/2020 Document Reviewed: 06/01/2020 Elsevier Patient Education    2023 Elsevier Inc.  

## 2023-03-14 ENCOUNTER — Encounter (HOSPITAL_COMMUNITY): Payer: Self-pay | Admitting: Obstetrics & Gynecology

## 2023-03-14 ENCOUNTER — Inpatient Hospital Stay (HOSPITAL_COMMUNITY): Payer: Self-pay

## 2023-03-14 ENCOUNTER — Inpatient Hospital Stay (HOSPITAL_COMMUNITY)
Admission: AD | Admit: 2023-03-14 | Discharge: 2023-03-14 | Disposition: A | Discharge status: Home / Self Care | Payer: Self-pay | Attending: Obstetrics & Gynecology | Admitting: Obstetrics & Gynecology

## 2023-03-14 ENCOUNTER — Other Ambulatory Visit (HOSPITAL_COMMUNITY): Payer: Self-pay

## 2023-03-14 ENCOUNTER — Ambulatory Visit (INDEPENDENT_AMBULATORY_CARE_PROVIDER_SITE_OTHER): Payer: Self-pay | Admitting: Obstetrics & Gynecology

## 2023-03-14 ENCOUNTER — Other Ambulatory Visit: Payer: Self-pay

## 2023-03-14 VITALS — BP 95/63 | HR 94 | Wt 120.2 lb

## 2023-03-14 DIAGNOSIS — O0990 Supervision of high risk pregnancy, unspecified, unspecified trimester: Secondary | ICD-10-CM

## 2023-03-14 DIAGNOSIS — Z3A27 27 weeks gestation of pregnancy: Secondary | ICD-10-CM | POA: Insufficient documentation

## 2023-03-14 DIAGNOSIS — O09522 Supervision of elderly multigravida, second trimester: Secondary | ICD-10-CM | POA: Insufficient documentation

## 2023-03-14 DIAGNOSIS — Z23 Encounter for immunization: Secondary | ICD-10-CM

## 2023-03-14 DIAGNOSIS — N390 Urinary tract infection, site not specified: Secondary | ICD-10-CM | POA: Insufficient documentation

## 2023-03-14 DIAGNOSIS — Z603 Acculturation difficulty: Secondary | ICD-10-CM

## 2023-03-14 DIAGNOSIS — Z758 Other problems related to medical facilities and other health care: Secondary | ICD-10-CM

## 2023-03-14 DIAGNOSIS — O0942 Supervision of pregnancy with grand multiparity, second trimester: Secondary | ICD-10-CM | POA: Insufficient documentation

## 2023-03-14 DIAGNOSIS — O24113 Pre-existing diabetes mellitus, type 2, in pregnancy, third trimester: Secondary | ICD-10-CM

## 2023-03-14 DIAGNOSIS — O24112 Pre-existing diabetes mellitus, type 2, in pregnancy, second trimester: Secondary | ICD-10-CM

## 2023-03-14 DIAGNOSIS — O2342 Unspecified infection of urinary tract in pregnancy, second trimester: Secondary | ICD-10-CM | POA: Insufficient documentation

## 2023-03-14 DIAGNOSIS — Z98891 History of uterine scar from previous surgery: Secondary | ICD-10-CM

## 2023-03-14 DIAGNOSIS — Z641 Problems related to multiparity: Secondary | ICD-10-CM

## 2023-03-14 DIAGNOSIS — O0992 Supervision of high risk pregnancy, unspecified, second trimester: Secondary | ICD-10-CM

## 2023-03-14 LAB — URINALYSIS, ROUTINE W REFLEX MICROSCOPIC

## 2023-03-14 LAB — COMPREHENSIVE METABOLIC PANEL
ALT: 13 U/L (ref 0–44)
AST: 19 U/L (ref 15–41)
Albumin: 3.2 g/dL — ABNORMAL LOW (ref 3.5–5.0)
Alkaline Phosphatase: 87 U/L (ref 38–126)
Anion gap: 7 (ref 5–15)
BUN: 6 mg/dL (ref 6–20)
CO2: 23 mmol/L (ref 22–32)
Calcium: 8.7 mg/dL — ABNORMAL LOW (ref 8.9–10.3)
Chloride: 104 mmol/L (ref 98–111)
Creatinine, Ser: 0.4 mg/dL — ABNORMAL LOW (ref 0.44–1.00)
GFR, Estimated: >60 mL/min (ref >60)
Glucose, Bld: 97 mg/dL (ref 70–99)
Potassium: 3.8 mmol/L (ref 3.5–5.1)
Sodium: 134 mmol/L — ABNORMAL LOW (ref 135–145)
Total Bilirubin: 0.6 mg/dL (ref 0.3–1.2)
Total Protein: 7.1 g/dL (ref 6.5–8.1)

## 2023-03-14 LAB — CBC WITH DIFFERENTIAL/PLATELET
Abs Immature Granulocytes: 0.04 10*3/uL (ref 0.00–0.07)
Basophils Absolute: 0 10*3/uL (ref 0.0–0.1)
Basophils Relative: 0 %
Eosinophils Absolute: 0.2 10*3/uL (ref 0.0–0.5)
Eosinophils Relative: 2 %
HCT: 34.6 % — ABNORMAL LOW (ref 36.0–46.0)
Hemoglobin: 12 g/dL (ref 12.0–15.0)
Immature Granulocytes: 0 %
Lymphocytes Relative: 19 %
Lymphs Abs: 2.5 10*3/uL (ref 0.7–4.0)
MCH: 29.1 pg (ref 26.0–34.0)
MCHC: 34.7 g/dL (ref 30.0–36.0)
MCV: 83.8 fL (ref 80.0–100.0)
Monocytes Absolute: 0.6 10*3/uL (ref 0.1–1.0)
Monocytes Relative: 5 %
Neutro Abs: 9.6 10*3/uL — ABNORMAL HIGH (ref 1.7–7.7)
Neutrophils Relative %: 74 %
Platelets: 372 10*3/uL (ref 150–400)
RBC: 4.13 MIL/uL (ref 3.87–5.11)
RDW: 13.5 % (ref 11.5–15.5)
WBC: 13 10*3/uL — ABNORMAL HIGH (ref 4.0–10.5)
nRBC: 0 % (ref 0.0–0.2)

## 2023-03-14 LAB — RPR: RPR Ser Ql: NONREACTIVE

## 2023-03-14 LAB — WET PREP, GENITAL
Clue Cells Wet Prep HPF POC: NONE SEEN
Sperm: NONE SEEN
Trich, Wet Prep: NONE SEEN
WBC, Wet Prep HPF POC: <10 (ref <10)
Yeast Wet Prep HPF POC: NONE SEEN

## 2023-03-14 LAB — URINALYSIS, MICROSCOPIC (REFLEX): RBC / HPF: >50 RBC/hpf (ref 0–5)

## 2023-03-14 LAB — POCT FERN TEST: POCT Fern Test: NEGATIVE

## 2023-03-14 LAB — HIV ANTIBODY (ROUTINE TESTING W REFLEX): HIV Screen 4th Generation wRfx: NONREACTIVE

## 2023-03-14 MED ORDER — ACETAMINOPHEN 500 MG PO TABS
1000.0000 mg | ORAL_TABLET | Freq: Four times a day (QID) | ORAL | Status: DC | PRN
  Administered 2023-03-14: 1000 mg via ORAL
  Filled 2023-03-14: qty 2

## 2023-03-14 MED ORDER — CEFADROXIL 500 MG PO CAPS: 500.0000 mg | ORAL_CAPSULE | Freq: Two times a day (BID) | ORAL | 0 refills | Status: DC

## 2023-03-14 MED ORDER — CEFADROXIL 500 MG PO CAPS
500.0000 mg | ORAL_CAPSULE | Freq: Two times a day (BID) | ORAL | 0 refills | Status: DC
  Filled 2023-03-14: qty 14, 7d supply, fill #0

## 2023-03-14 MED ORDER — ACETAMINOPHEN 500 MG PO TABS: 1000.0000 mg | ORAL_TABLET | Freq: Four times a day (QID) | ORAL | 0 refills | Status: AC | PRN

## 2023-03-14 MED ORDER — ACETAMINOPHEN 500 MG PO TABS
1000.0000 mg | ORAL_TABLET | Freq: Four times a day (QID) | ORAL | 0 refills | Status: DC | PRN
  Filled 2023-03-14: qty 30, 4d supply, fill #0

## 2023-03-14 NOTE — MAU Provider Note (Signed)
History     CSN: [id number]  Arrival date and time: 03/14/23 1030   Event Date/Time   First Provider Initiated Contact with Patient 03/14/23 1125      Chief Complaint  Patient presents with   Hematuria   Abdominal Pain   Flank Pain   37 y.o. [id number] @27 .4 wks presenting with LAP and back pain. Reports pain started 4 days ago. Pain is intermittent, lasting 1-2 min, and frequency twice an hour. Describes as labor pain. Pain also wraps around her right flank to her back. Rates pain 3/10. Denies VB but reports leaking clear fluid twice last week, none since. No recent IC. Endorses dysuria, frequency, hematuria, and incontinence x4 days.    OB History     Gravida  9   Para  8   Term  8   Preterm      AB      Living  8      SAB      IAB      Ectopic      Multiple  0   Live Births  8           Past Medical History:  Diagnosis Date   Abscess 01/14/2019   gluteal    Abscess and cellulitis of gluteal region 01/14/2019   DM (diabetes mellitus), type 2 [facility])     Past Surgical History:  Procedure Laterality Date   CESAREAN SECTION     CESAREAN SECTION N/A 07/20/2020   Procedure: CESAREAN SECTION;  Surgeon: Conan Bowens, MD;  Location: [facility] LD ORS;  Service: Obstetrics;  Laterality: N/A;   CHOLECYSTECTOMY      Family History  Problem Relation Age of Onset   Diabetes Mother     Social History   Tobacco Use   Smoking status: Never   Smokeless tobacco: Never  Vaping Use   Vaping Use: Never used  Substance Use Topics   Alcohol use: Never   Drug use: Never    Allergies: No Known Allergies  Medications Prior to Admission  Medication Sig Dispense Refill Last Dose   aspirin EC 81 MG tablet Take 1 tablet (81 mg total) by mouth daily. Swallow whole. 30 tablet 12 03/13/2023   metFORMIN (GLUCOPHAGE) 500 MG tablet Take 1 tablet (500 mg total) by mouth 2 (two) times daily with a meal. (Patient taking differently: Take 500 mg by mouth daily.) 60 tablet 1  03/13/2023   Prenatal Vit-Fe Fumarate-FA (PRENATAL VITAMINS) 28-0.8 MG TABS Take 1 tablet by mouth daily. 30 tablet 9 03/13/2023    Review of Systems  Constitutional:  Negative for chills and fever.  Gastrointestinal:  Positive for abdominal pain.  Genitourinary:  Positive for dysuria, flank pain, frequency and hematuria. Negative for urgency, vaginal bleeding and vaginal discharge.  Musculoskeletal:  Positive for back pain.   Physical Exam   Blood pressure (!) 95/59, pulse 84, temperature (!) 97.5 F (36.4 C), temperature source Axillary, resp. rate 16, weight 54.7 kg, last menstrual period 09/02/2022, SpO2 99 %, unknown if currently breastfeeding.  Physical Exam Vitals and nursing note reviewed. Exam conducted with a chaperone present.  Constitutional:      General: She is not in acute distress.    Appearance: Normal appearance.  HENT:     Head: Normocephalic and atraumatic.  Cardiovascular:     Rate and Rhythm: Normal rate.  Pulmonary:     Effort: Pulmonary effort is normal. No respiratory distress.  Abdominal:     Palpations: Abdomen is  soft.     Tenderness: There is no abdominal tenderness. There is no right CVA tenderness or left CVA tenderness.     Comments: gravid  Genitourinary:    Comments: VE: closed/thick Musculoskeletal:        General: Normal range of motion.     Cervical back: Normal range of motion.  Skin:    General: Skin is warm and dry.  Neurological:     General: No focal deficit present.     Mental Status: She is alert and oriented to person, place, and time.  Psychiatric:        Mood and Affect: Mood normal.        Behavior: Behavior normal.   EFM: 135 bpm, mod variability, + accels, no decels Toco: none  Results for orders placed or performed during the hospital encounter of 03/14/23 (from the past 24 hour(s))  CBC with Differential/Platelet     Status: Abnormal   Collection Time: 03/14/23 10:51 AM  Result Value Ref Range   WBC 13.0 (H) 4.0 -  10.5 K/uL   RBC 4.13 3.87 - 5.11 MIL/uL   Hemoglobin 12.0 12.0 - 15.0 g/dL   HCT 16.1 (L) 09.6 - 04.5 %   MCV 83.8 80.0 - 100.0 fL   MCH 29.1 26.0 - 34.0 pg   MCHC 34.7 30.0 - 36.0 g/dL   RDW 40.9 81.1 - 91.4 %   Platelets 372 150 - 400 K/uL   nRBC 0.0 0.0 - 0.2 %   Neutrophils Relative % 74 %   Neutro Abs 9.6 (H) 1.7 - 7.7 K/uL   Lymphocytes Relative 19 %   Lymphs Abs 2.5 0.7 - 4.0 K/uL   Monocytes Relative 5 %   Monocytes Absolute 0.6 0.1 - 1.0 K/uL   Eosinophils Relative 2 %   Eosinophils Absolute 0.2 0.0 - 0.5 K/uL   Basophils Relative 0 %   Basophils Absolute 0.0 0.0 - 0.1 K/uL   Immature Granulocytes 0 %   Abs Immature Granulocytes 0.04 0.00 - 0.07 K/uL  Comprehensive metabolic panel     Status: Abnormal   Collection Time: 03/14/23 10:51 AM  Result Value Ref Range   Sodium 134 (L) 135 - 145 mmol/L   Potassium 3.8 3.5 - 5.1 mmol/L   Chloride 104 98 - 111 mmol/L   CO2 23 22 - 32 mmol/L   Glucose, Bld 97 70 - 99 mg/dL   BUN 6 6 - 20 mg/dL   Creatinine, Ser 7.82 (L) 0.44 - 1.00 mg/dL   Calcium 8.7 (L) 8.9 - 10.3 mg/dL   Total Protein 7.1 6.5 - 8.1 g/dL   Albumin 3.2 (L) 3.5 - 5.0 g/dL   AST 19 15 - 41 U/L   ALT 13 0 - 44 U/L   Alkaline Phosphatase 87 38 - 126 U/L   Total Bilirubin 0.6 0.3 - 1.2 mg/dL   GFR, Estimated >95 >62 mL/min   Anion gap 7 5 - 15  HIV Antibody (routine testing w rflx)     Status: None   Collection Time: 03/14/23 10:51 AM  Result Value Ref Range   HIV Screen 4th Generation wRfx Non Reactive Non Reactive  RPR     Status: None   Collection Time: 03/14/23 10:51 AM  Result Value Ref Range   RPR Ser Ql NON REACTIVE NON REACTIVE  Urinalysis, Routine w reflex microscopic -Urine, Clean Catch     Status: Abnormal   Collection Time: 03/14/23 11:13 AM  Result Value Ref Range  Color, Urine RED (A) YELLOW   APPearance TURBID (A) CLEAR   Specific Gravity, Urine  1.005 - 1.030    TEST NOT REPORTED DUE TO COLOR INTERFERENCE OF URINE PIGMENT   pH  5.0 -  8.0    TEST NOT REPORTED DUE TO COLOR INTERFERENCE OF URINE PIGMENT   Glucose, UA (A) NEGATIVE mg/dL    TEST NOT REPORTED DUE TO COLOR INTERFERENCE OF URINE PIGMENT   Hgb urine dipstick (A) NEGATIVE    TEST NOT REPORTED DUE TO COLOR INTERFERENCE OF URINE PIGMENT   Bilirubin Urine (A) NEGATIVE    TEST NOT REPORTED DUE TO COLOR INTERFERENCE OF URINE PIGMENT   Ketones, ur (A) NEGATIVE mg/dL    TEST NOT REPORTED DUE TO COLOR INTERFERENCE OF URINE PIGMENT   Protein, ur (A) NEGATIVE mg/dL    TEST NOT REPORTED DUE TO COLOR INTERFERENCE OF URINE PIGMENT   Nitrite (A) NEGATIVE    TEST NOT REPORTED DUE TO COLOR INTERFERENCE OF URINE PIGMENT   Leukocytes,Ua (A) NEGATIVE    TEST NOT REPORTED DUE TO COLOR INTERFERENCE OF URINE PIGMENT  Urinalysis, Microscopic (reflex)     Status: Abnormal   Collection Time: 03/14/23 11:13 AM  Result Value Ref Range   RBC / HPF >50 0 - 5 RBC/hpf   WBC, UA 6-10 0 - 5 WBC/hpf   Bacteria, UA RARE (A) NONE SEEN   Squamous Epithelial / HPF 0-5 0 - 5 /HPF  Wet prep, genital     Status: None   Collection Time: 03/14/23 11:38 AM  Result Value Ref Range   Yeast Wet Prep HPF POC NONE SEEN NONE SEEN   Trich, Wet Prep NONE SEEN NONE SEEN   Clue Cells Wet Prep HPF POC NONE SEEN NONE SEEN   WBC, Wet Prep HPF POC <10 <10   Sperm NONE SEEN   Fern Test     Status: Normal   Collection Time: 03/14/23 11:47 AM  Result Value Ref Range   POCT Fern Test Negative = intact amniotic membranes    US Renal  Result Date: 03/14/2023 CLINICAL DATA:  Flank pain EXAM: RENAL / URINARY TRACT ULTRASOUND COMPLETE COMPARISON:  None Available. FINDINGS: Right Kidney: Renal measurements: 11.0 x 4.2 x 6.1 cm = volume: 148 mL. Echogenicity within normal limits. No mass or hydronephrosis visualized. Left Kidney: Renal measurements: 11.2 x 5.3 x 4.9 cm = volume: 153 mL. Echogenicity within normal limits. No mass or hydronephrosis visualized. Bladder: The bladder is only partially fluid-filled and  incompletely assessed. Other: None. IMPRESSION: 1. No sonographic abnormalities are seen in the kidneys. 2. The bladder is only partially fluid-filled and incompletely assessed. Electronically Signed   By: Lorenza Cambridge M.D.   On: 03/14/2023 14:12    MAU Course  Procedures Tylenol  MDM Labs and [place] ordered and reviewed. Pain improved with Tylenol. No evidence of stones or pyelo, will treat for presumptive UTI. Consult with Dr. Mena Goes, plan for outpt f/u with Urology. Stable for discharge home.   Assessment and Plan   1. Supervision of high risk pregnancy, antepartum   2. AMA (advanced maternal age) multigravida 35+, second trimester   3. [redacted] weeks gestation of pregnancy   4. Urinary tract infection in mother during second trimester of pregnancy    Discharge home Follow up at [facility] as scheduled Follow up with [facility] Rx Keflex Rx Tylenol Return precautions  Allergies as of 03/14/2023   No Known Allergies      Medication List  TAKE these medications    acetaminophen 500 MG tablet Commonly known as: TYLENOL Take 2 tablets (1,000 mg total) by mouth every 6 (six) hours as needed for moderate pain.   aspirin EC 81 MG tablet Take 1 tablet (81 mg total) by mouth daily. Swallow whole.   cefadroxil 500 MG capsule Commonly known as: DURICEF Take 1 capsule (500 mg total) by mouth 2 (two) times daily.   metFORMIN 500 MG tablet Commonly known as: GLUCOPHAGE Take 1 tablet (500 mg total) by mouth 2 (two) times daily with a meal. What changed: when to take this   Prenatal Vitamins 28-0.8 MG Tabs Take 1 tablet by mouth daily.       Spanish interpreter used for encounter Donette Larry, CNM 03/14/2023, 3:15 PM

## 2023-03-14 NOTE — MAU Note (Signed)
.  Angelica [name] is a 37 y.o. at [redacted]w[redacted]d here in MAU reporting: Sent over from the office by Dr. Debroah Loop for evaluation of hematuria, right lower quadrant pain, and right sided flank pain. Per patient she has noted blood in her urine for the past three days. She also reports right sided lower abdominal pain as well as right sided flank pain for four days. She last felt this pain last night. She reports she has also noted light pink vaginal bleeding when she wipes. Endorses burning with urination and strong smelling urine. Denies LOF. +FM.  Per Dr. Debroah Loop patient needs HIV, RPR, and CBC.  Onset of complaint: x3 days blood in urine, x4 days pain Pain score:  3/10 right lower abdomen 3/10 right flank  FHT: 148 initial external Lab orders placed from triage: UA

## 2023-03-14 NOTE — Progress Notes (Signed)
   PRENATAL VISIT NOTE  Subjective:  Angelica [name] is a 37 y.o. [phone] at [redacted]w[redacted]d being seen today for ongoing prenatal care.  She is currently monitored for the following issues for this high-risk pregnancy and has Diabetes mellitus type 2, uncontrolled, without complications; History of VBAC; Language barrier; Pre-existing type 2 diabetes mellitus during pregnancy; Illiterate; [place] multip[place]; Cesarean delivery delivered; Supervision of high risk pregnancy, antepartum; AMA (advanced maternal age) multigravida 35+, second trimester; and Diabetes mellitus (HCC) on their problem list.  Patient reports  hematuria RLQ and r flank pain with no fever .  Contractions: Irritability. Vag. Bleeding: None.  Movement: Present. Denies leaking of fluid.   The following portions of the patient's history were reviewed and updated as appropriate: allergies, current medications, past family history, past medical history, past social history, past surgical history and problem list.   Objective:   Vitals:   03/14/23 0902  BP: 95/63  Pulse: 94  Weight: 120 lb 3.2 oz (54.5 kg)    Fetal Status: Fetal Heart Rate (bpm): 130   Movement: Present     General:  Alert, oriented and cooperative. Patient is in no acute distress.  Skin: Skin is warm and dry. No rash noted.   Cardiovascular: Normal heart rate noted  Respiratory: Normal respiratory effort, no problems with respiration noted  Abdomen: Soft, gravid, appropriate for gestational age.  Pain/Pressure: Present     Pelvic: Cervical exam deferred        Extremities: Normal range of motion.  Edema: None  Mental Status: Normal mood and affect. Normal behavior. Normal judgment and thought content.  Mild RLQ tenderness no CVAT Assessment and Plan:  Pregnancy: [id number] at [redacted]w[redacted]d 1. Pre-existing type 2 diabetes mellitus during pregnancy in third trimester   Media Information  Document Information Values seem very consistent and she needs to bring her meter  to confirm they are valid Photos    03/14/2023 09:20  Attached To:  Routine Prenatal on 03/14/23 with Adam Phenix, MD  Source Information  Adam Phenix, MD  [facility]   - RPR - HIV Antibody (routine testing w rflx) - CBC - Tdap vaccine greater than or equal to 7yo IM  2. Supervision of high risk pregnancy, antepartum Needs [place] f/u for DM  3. Cesarean delivery delivered Repeat 37 weeks  4. [place] multipara   5. Language barrier Spanish interpreter  6. History of VBAC   7. AMA (advanced maternal age) multigravida 35+, second trimester 37 y.o. Needs evaluation for pyelonephritis vs. Stone   Send to MAU  Preterm labor symptoms and general obstetric precautions including but not limited to vaginal bleeding, contractions, leaking of fluid and fetal movement were reviewed in detail with the patient. Please refer to After Visit Summary for other counseling recommendations.   Return in about 2 weeks (around 03/28/2023).  Future Appointments  Date Time Provider Department Center  04/05/2023  8:15 AM Corlis Hove, NP [facility]    Scheryl Darter, MD

## 2023-03-15 LAB — CULTURE, OB URINE: Culture: NO GROWTH

## 2023-03-15 LAB — GC/CHLAMYDIA PROBE AMP (~~LOC~~) NOT AT ARMC
Chlamydia: NEGATIVE
Comment: NEGATIVE
Comment: NORMAL
Neisseria Gonorrhea: NEGATIVE

## 2023-03-27 ENCOUNTER — Encounter: Payer: Self-pay | Admitting: Family Medicine

## 2023-03-27 ENCOUNTER — Other Ambulatory Visit: Payer: Self-pay

## 2023-03-27 ENCOUNTER — Ambulatory Visit (INDEPENDENT_AMBULATORY_CARE_PROVIDER_SITE_OTHER): Payer: Self-pay | Admitting: Family Medicine

## 2023-03-27 VITALS — BP 99/63 | HR 74 | Wt 123.8 lb

## 2023-03-27 DIAGNOSIS — Z3009 Encounter for other general counseling and advice on contraception: Secondary | ICD-10-CM

## 2023-03-27 DIAGNOSIS — O09522 Supervision of elderly multigravida, second trimester: Secondary | ICD-10-CM

## 2023-03-27 DIAGNOSIS — R310 Gross hematuria: Secondary | ICD-10-CM

## 2023-03-27 DIAGNOSIS — Z641 Problems related to multiparity: Secondary | ICD-10-CM

## 2023-03-27 DIAGNOSIS — Z98891 History of uterine scar from previous surgery: Secondary | ICD-10-CM

## 2023-03-27 DIAGNOSIS — Z758 Other problems related to medical facilities and other health care: Secondary | ICD-10-CM

## 2023-03-27 DIAGNOSIS — O24113 Pre-existing diabetes mellitus, type 2, in pregnancy, third trimester: Secondary | ICD-10-CM

## 2023-03-27 DIAGNOSIS — Z603 Acculturation difficulty: Secondary | ICD-10-CM

## 2023-03-27 DIAGNOSIS — O0990 Supervision of high risk pregnancy, unspecified, unspecified trimester: Secondary | ICD-10-CM

## 2023-03-27 MED ORDER — METFORMIN HCL 1000 MG PO TABS
1000.0000 mg | ORAL_TABLET | Freq: Two times a day (BID) | ORAL | 3 refills | Status: DC
Start: 2023-03-27 — End: 2023-04-27

## 2023-03-27 NOTE — Progress Notes (Signed)
   Subjective:  Angelica [name] is a 37 y.o. [phone] at [redacted]w[redacted]d being seen today for ongoing prenatal care.  She is currently monitored for the following issues for this high-risk pregnancy and has History of VBAC; Language barrier; Pre-existing type 2 diabetes mellitus during pregnancy; Illiterate; Grand multipara; Cesarean delivery delivered; Supervision of high risk pregnancy, antepartum; AMA (advanced maternal age) multigravida 35+, second trimester; Unwanted fertility; and Gross hematuria on their problem list.  Patient reports no complaints.  Contractions: Irritability. Vag. Bleeding: None.  Movement: Present. Denies leaking of fluid.   The following portions of the patient's history were reviewed and updated as appropriate: allergies, current medications, past family history, past medical history, past social history, past surgical history and problem list. Problem list updated.  Objective:   Vitals:   03/27/23 1333  BP: 99/63  Pulse: 74  Weight: 123 lb 12.8 oz (56.2 kg)    Fetal Status: Fetal Heart Rate (bpm): 145 Fundal Height: 26 cm Movement: Present     General:  Alert, oriented and cooperative. Patient is in no acute distress.  Skin: Skin is warm and dry. No rash noted.   Cardiovascular: Normal heart rate noted  Respiratory: Normal respiratory effort, no problems with respiration noted  Abdomen: Soft, gravid, appropriate for gestational age. Pain/Pressure: Absent     Pelvic: Vag. Bleeding: None     Cervical exam deferred        Extremities: Normal range of motion.  Edema: None  Mental Status: Normal mood and affect. Normal behavior. Normal judgment and thought content.   Urinalysis:      Assessment and Plan:  Pregnancy: [id number] at [redacted]w[redacted]d  1. Supervision of high risk pregnancy, antepartum BP and FHR normal FH only 26 cm, needs growth [place] regardless for T2DM, will schedule with [facility] Discussed recent [facility] visit,   2. Pre-existing type 2 diabetes mellitus during  pregnancy in third trimester Currently on ASA and metformin 500 mg BID Forgot logs On recall fasting sugars are 70-90's, post prandials are 110-120's Increase metformin to 1g BID Needs growth [place], we will schedule w [facility] Fetal echo still not scheduled yet, we will help coordinate Reminded of antenatal testing to start at 32 weeks  3. AMA (advanced maternal age) multigravida 35+, second trimester On ASA  4. Cesarean delivery delivered Para 8, 2nd delivery PCS for NRFHT, 8th delivery RCS due to oblique presentation s/p failed ECV Prior op note reviewed, J incision up L side of uterus Message sent today to schedule RCS+BTL at 37 weeks  5. Grand multipara   6. History of VBAC   7. Language barrier Spanish  8. Unwanted fertility Confirmed with patient that she desires BTL at time of RCS Uninsured, BTL papers n/a  9. 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 Please refer to After Visit Summary for other counseling recommendations.  Return in 2 weeks (on 04/10/2023) for [facility], ob visit.   Venora Maples, MD

## 2023-03-27 NOTE — Patient Instructions (Addendum)
[facility]  04/19/23 @ 10 AM [phone] [address] [place], [place] [address]   Eleccin del mtodo anticonceptivo Contraception Choices La anticoncepcin, o los mtodos anticonceptivos, hace referencia a los mtodos o dispositivos que evitan el [place]. Mtodos hormonales  Implante anticonceptivo Un implante anticonceptivo consiste en un tubo delgado de plstico que contiene una hormona que evita el [place]. Es diferente de un dispositivo intrauterino (DIU). Un mdico lo inserta en la parte superior del brazo. Los implantes pueden ser eficaces durante un mximo de 3 aos. Inyecciones de progestina sola Las inyecciones de progestina sola contienen progestina, una forma sinttica de la hormona progesterona. Un mdico las administra cada 3 meses. Pldoras anticonceptivas Las pldoras anticonceptivas son pastillas que contienen hormonas que evitan el [place]. Deben tomarse una vez al da, preferentemente a la misma Economist. Se necesita una receta para utilizar este mtodo anticonceptivo. Parche anticonceptivo El parche anticonceptivo contiene hormonas que evitan el [place]. Se coloca en la piel, debe cambiarse una vez a la semana durante tres semanas y debe retirarse en la cuarta semana. Se necesita una receta para utilizar este mtodo anticonceptivo. Anillo vaginal Un anillo vaginal contiene hormonas que evitan el embarazo. Se coloca en la vagina durante tres semanas y se retira en la cuarta semana. Luego se repite el proceso con un anillo nuevo. Se necesita una receta para utilizar este mtodo anticonceptivo. Anticonceptivo de emergencia Los anticonceptivos de emergencia son mtodos para evitar un embarazo despus de Warehouse manager sexo sin proteccin. Vienen en forma de pldora y pueden tomarse hasta 5 das despus de [place]. Funcionan mejor cuando se toman lo ms pronto posible luego de [employer]. La mayora de los anticonceptivos de emergencia estn  disponibles sin receta mdica. Este mtodo no debe utilizarse como el nico mtodo anticonceptivo. Mtodos de barrera  Condn masculino Un condn masculino es una vaina delgada que se coloca sobre el pene durante el sexo. Los condones evitan que el esperma ingrese en el cuerpo de la [place]. Pueden utilizarse con un una sustancia que mata a los espermatozoides (espermicida) para aumentar la efectividad. Deben desecharse despus de un uso. Condn femenino Un condn femenino es una vaina blanda y holgada que se coloca en la vagina antes de [place]. El condn evita que el esperma ingrese en el cuerpo de la [place]. Deben desecharse despus de un uso. Diafragma Un diafragma es una barrera blanda con forma de cpula. Se inserta en la vagina antes del sexo, junto con un espermicida. El diafragma bloquea el ingreso de esperma en el tero, y el espermicida mata a los espermatozoides. El Designer, fashion/clothing en la vagina durante 6 a 8 horas despus de Warehouse manager sexo y debe retirarse en el plazo de las 24 horas. Un diafragma es recetado y colocado por un mdico. Debe reemplazarse cada 1 a 2 aos, despus de dar a luz, de aumentar ms de 15 lb (6.8 kg) y de [place] plvica. Capuchn cervical Un capuchn cervical es una copa redonda y blanda de ltex o plstico que se coloca en el cuello uterino. Se inserta en la vagina antes del sexo, junto con un espermicida. Bloquea el ingreso del esperma en el tero. El capuchn Radio producer durante 6 a 8 horas despus de Warehouse manager sexo y debe retirarse en el plazo de las 48 horas. Un capuchn cervical debe ser recetado y colocado por un mdico. Debe reemplazarse cada 2 aos. Esponja Una esponja es una pieza blanda y  circular de espuma de poliuretano que contiene espermicida. La esponja ayuda a bloquear el ingreso de esperma en el tero, y el espermicida mata a los espermatozoides. Belva Bertin, debe humedecerla e insertarla en la vagina. Debe insertarse antes  de eBay, debe permanecer dentro al menos durante 6 horas despus de tener sexo y debe retirarse y Nurse, adult en el plazo de las 30 horas. Espermicidas Los espermicidas son sustancias qumicas que matan o bloquean al esperma y no lo dejan ingresar al cuello uterino y al tero. Vienen en forma de crema, gel, supositorio, espuma o comprimido. Un espermicida debe insertarse en la vagina con un aplicador al menos 10 o 15 minutos antes de tener sexo para dar tiempo a que surta [place]. El proceso debe repetirse cada vez que tenga sexo. Los espermicidas no requieren Emergency planning/management officer. Anticonceptivos intrauterinos Dispositivo intrauterino (DIU) Un DIU es un dispositivo en forma de T que se coloca en el tero. Existen dos tipos: DIU hormonal.Este tipo contiene progestina, una forma sinttica de la hormona progesterona. Este tipo puede permanecer colocado durante 3 a 5 aos. DIU de cobre.Este tipo est recubierto con un alambre de cobre. Puede permanecer colocado durante 10 aos. Mtodos anticonceptivos permanentes Ligadura de trompas en la mujer En este mtodo, se sellan, atan u obstruyen las trompas de [place] durante una ciruga para Automotive engineer que el vulo descienda [place]. Esterilizacin histeroscpica En este mtodo, se coloca un implante pequeo y flexible dentro de cada trompa de [place]op[place]. Los implantes hacen que se forme un tejido cicatricial en las trompas de [place]op[place] y que las obstruya para que el espermatozoide no pueda llegar al vulo. El procedimiento demora alrededor de 3 meses para que sea [place]. Debe utilizarse otro mtodo anticonceptivo durante esos 3 meses. Esterilizacin masculina Este es un procedimiento que consiste en atar los conductos que transportan el esperma (vasectoma). Luego del procedimiento, el hombre Manufacturing engineer lquido (semen). Debe utilizarse otro mtodo anticonceptivo durante 3 meses despus del procedimiento. Mtodos de planificacin natural Planificacin  familiar natural En este mtodo, la pareja no tiene [employer] la mujer podra quedar [place]. Mtodo calendario En este mtodo, la mujer realiza un seguimiento de la duracin de cada ciclo menstrual, identifica los [employer] que se puede producir un Psychiatrist y no tiene sexo durante esos [address]  [address]. Mtodo de la ovulacin En este mtodo, la pareja evita tener sexo durante la ovulacin. Mtodo sintotrmico Este mtodo implica no tener sexo durante la ovulacin. Normalmente, la mujer comprueba la ovulacin al observar cambios en su temperatura y en la consistencia del moco cervical. Mtodo posovulacin En este mtodo, la pareja espera a que finalice la ovulacin para Doctor, hospital. Dnde buscar ms informacin Centers for Disease Control and Prevention (Centros para el Control y Psychiatrist de Event organiser): [email or web address] Resumen La anticoncepcin, o los mtodos anticonceptivos, hace referencia a los mtodos o dispositivos que evitan el [place]. Los mtodos anticonceptivos hormonales incluyen implantes, inyecciones, pastillas, parches, anillos vaginales y anticonceptivos de Associate Professor. Los mtodos anticonceptivos de barrera pueden incluir condones masculinos, condones femeninos, diafragmas, capuchones cervicales, esponjas y espermicidas. [employer] tipos de DIU (dispositivo intrauterino). Un DIU puede colocarse en el tero de una mujer para evitar el embarazo durante 3 a 5 aos. La esterilizacin permanente puede realizarse mediante un procedimiento tanto en los hombres como en las mujeres. Los [employer] de Medical sales representative natural implican no tener [employer] la mujer podra quedar [place]. Esta informacin no tiene Theme park manager  el consejo del mdico. Asegrese de hacerle al mdico cualquier pregunta que tenga. Document Revised: 06/01/2020 Document Reviewed: 06/01/2020 Elsevier Patient Education  2023 [employer].

## 2023-03-28 ENCOUNTER — Encounter: Payer: Self-pay | Admitting: Family Medicine

## 2023-03-28 NOTE — Addendum Note (Signed)
Addended by: Maxwell Marion E on: 03/28/2023 01:50 PM   Modules accepted: Orders

## 2023-03-30 ENCOUNTER — Telehealth: Payer: Self-pay

## 2023-03-30 NOTE — Telephone Encounter (Signed)
Front office notified to send referral records to [facility]. [facility] [place] scheduled for 04/06/23 @ 2 PM.

## 2023-04-02 NOTE — Telephone Encounter (Signed)
Called pt with interpreter Debarah Crape to review urology referral sent and to give [facility] contact information. Reviewed upcoming [place] appt and details given.

## 2023-04-05 ENCOUNTER — Encounter: Payer: Self-pay | Admitting: Student

## 2023-04-11 ENCOUNTER — Ambulatory Visit (INDEPENDENT_AMBULATORY_CARE_PROVIDER_SITE_OTHER): Payer: Self-pay | Admitting: Student

## 2023-04-11 ENCOUNTER — Other Ambulatory Visit: Payer: Self-pay

## 2023-04-11 VITALS — BP 96/45 | HR 89 | Wt 125.9 lb

## 2023-04-11 DIAGNOSIS — O0990 Supervision of high risk pregnancy, unspecified, unspecified trimester: Secondary | ICD-10-CM

## 2023-04-11 DIAGNOSIS — Z758 Other problems related to medical facilities and other health care: Secondary | ICD-10-CM

## 2023-04-11 DIAGNOSIS — Z641 Problems related to multiparity: Secondary | ICD-10-CM

## 2023-04-11 DIAGNOSIS — O24113 Pre-existing diabetes mellitus, type 2, in pregnancy, third trimester: Secondary | ICD-10-CM

## 2023-04-11 DIAGNOSIS — O09523 Supervision of elderly multigravida, third trimester: Secondary | ICD-10-CM

## 2023-04-11 DIAGNOSIS — Z603 Acculturation difficulty: Secondary | ICD-10-CM

## 2023-04-11 DIAGNOSIS — Z3009 Encounter for other general counseling and advice on contraception: Secondary | ICD-10-CM

## 2023-04-11 DIAGNOSIS — O0993 Supervision of high risk pregnancy, unspecified, third trimester: Secondary | ICD-10-CM

## 2023-04-11 DIAGNOSIS — Z3A31 31 weeks gestation of pregnancy: Secondary | ICD-10-CM

## 2023-04-11 DIAGNOSIS — Z98891 History of uterine scar from previous surgery: Secondary | ICD-10-CM

## 2023-04-11 NOTE — Progress Notes (Signed)
   PRENATAL VISIT NOTE  Subjective:  Angelica [name] is a 37 y.o. [phone] at [redacted]w[redacted]d being seen today for ongoing prenatal care.  She is currently monitored for the following issues for this high-risk pregnancy and has History of VBAC; Language barrier; Pre-existing type 2 diabetes mellitus during pregnancy; Grand multipara; Cesarean delivery delivered; Supervision of high risk pregnancy, antepartum; AMA (advanced maternal age) multigravida 35+, second trimester; Unwanted fertility; and Gross hematuria on their problem list.  Patient reports no complaints.  Contractions: Not present. Vag. Bleeding: None.  Movement: Present. Denies leaking of fluid.   The following portions of the patient's history were reviewed and updated as appropriate: allergies, current medications, past family history, past medical history, past social history, past surgical history and problem list.   Objective:   Vitals:   04/11/23 1450  BP: (!) 96/45  Pulse: 89  Weight: 125 lb 14.4 oz (57.1 kg)    Fetal Status: Fetal Heart Rate (bpm): 145 Fundal Height: 29 cm Movement: Present     General:  Alert, oriented and cooperative. Patient is in no acute distress.  Skin: Skin is warm and dry. No rash noted.   Cardiovascular: Normal heart rate noted  Respiratory: Normal respiratory effort, no problems with respiration noted  Abdomen: Soft, gravid, appropriate for gestational age.  Pain/Pressure: Absent     Pelvic: Cervical exam deferred        Extremities: Normal range of motion.  Edema: None  Mental Status: Normal mood and affect. Normal behavior. Normal judgment and thought content.   Assessment and Plan:  Pregnancy: [id number] at [redacted]w[redacted]d 1. Supervision of high risk pregnancy, antepartum - frequent fetal movement   2. [redacted] weeks gestation of pregnancy - continue routine follow-up   3. Pre-existing type 2 diabetes mellitus during pregnancy in third trimester - Metformin BID - 60-95 Fasting values, patient report -  Post meal sugars <125, patient report  - encouraged to bring log to next visit - BPP order placed for 32 weeks   4. AMA (advanced maternal age) multigravida 35+, third trimester - on ASA  5. Grand multipara   6. History of VBAC - scheduled RCS+BTL at 37 week   7. Unwanted fertility - BTL scheduled   8. Language barrier - interpreter present at bedside  Preterm labor symptoms and general obstetric precautions including but not limited to vaginal bleeding, contractions, leaking of fluid and fetal movement were reviewed in detail with the patient. Please refer to After Visit Summary for other counseling recommendations.   Return in about 2 weeks (around 04/25/2023) for [facility], IN-PERSON.  Future Appointments  Date Time Provider Department Center  04/18/2023  1:15 PM [facility]  04/18/2023  2:15 PM Warden Fillers, MD [facility]    Corlis Hove, NP

## 2023-04-18 ENCOUNTER — Other Ambulatory Visit: Payer: Self-pay | Admitting: General Practice

## 2023-04-18 ENCOUNTER — Ambulatory Visit (INDEPENDENT_AMBULATORY_CARE_PROVIDER_SITE_OTHER): Payer: Self-pay | Admitting: Obstetrics and Gynecology

## 2023-04-18 ENCOUNTER — Ambulatory Visit (INDEPENDENT_AMBULATORY_CARE_PROVIDER_SITE_OTHER): Payer: Self-pay

## 2023-04-18 VITALS — BP 99/59 | HR 78 | Wt 125.1 lb

## 2023-04-18 DIAGNOSIS — Z603 Acculturation difficulty: Secondary | ICD-10-CM

## 2023-04-18 DIAGNOSIS — Z3A32 32 weeks gestation of pregnancy: Secondary | ICD-10-CM

## 2023-04-18 DIAGNOSIS — Z98891 History of uterine scar from previous surgery: Secondary | ICD-10-CM

## 2023-04-18 DIAGNOSIS — Z758 Other problems related to medical facilities and other health care: Secondary | ICD-10-CM

## 2023-04-18 DIAGNOSIS — O24113 Pre-existing diabetes mellitus, type 2, in pregnancy, third trimester: Secondary | ICD-10-CM

## 2023-04-18 DIAGNOSIS — O0990 Supervision of high risk pregnancy, unspecified, unspecified trimester: Secondary | ICD-10-CM

## 2023-04-18 DIAGNOSIS — Z3009 Encounter for other general counseling and advice on contraception: Secondary | ICD-10-CM

## 2023-04-18 DIAGNOSIS — Z641 Problems related to multiparity: Secondary | ICD-10-CM

## 2023-04-18 DIAGNOSIS — O09522 Supervision of elderly multigravida, second trimester: Secondary | ICD-10-CM

## 2023-04-18 DIAGNOSIS — O0993 Supervision of high risk pregnancy, unspecified, third trimester: Secondary | ICD-10-CM

## 2023-04-18 NOTE — Progress Notes (Signed)
Lower abdominal pain.  

## 2023-04-18 NOTE — Progress Notes (Signed)
   PRENATAL VISIT NOTE  Subjective:  Angelica [name] is a 37 y.o. [phone] at [redacted]w[redacted]d being seen today for ongoing prenatal care.  She is currently monitored for the following issues for this high-risk pregnancy and has History of VBAC; Language barrier; Pre-existing type 2 diabetes mellitus during pregnancy; Grand multipara; Cesarean delivery delivered; Supervision of high risk pregnancy, antepartum; AMA (advanced maternal age) multigravida 35+, second trimester; Unwanted fertility; and Gross hematuria on their problem list.  Patient doing well with no acute concerns today. She reports no complaints.  Contractions: Regular. Vag. Bleeding: None.  Movement: Present. Denies leaking of fluid.   The following portions of the patient's history were reviewed and updated as appropriate: allergies, current medications, past family history, past medical history, past social history, past surgical history and problem list. Problem list updated.  Objective:   Vitals:   04/18/23 1412  BP: (!) 99/59  Pulse: 78  Weight: 125 lb 1.6 oz (56.7 kg)    Fetal Status: Fetal Heart Rate (bpm): 131 Fundal Height: 32 cm Movement: Present     General:  Alert, oriented and cooperative. Patient is in no acute distress.  Skin: Skin is warm and dry. No rash noted.   Cardiovascular: Normal heart rate noted  Respiratory: Normal respiratory effort, no problems with respiration noted  Abdomen: Soft, gravid, appropriate for gestational age.  Pain/Pressure: Present     Pelvic: Cervical exam deferred        Extremities: Normal range of motion.     Mental Status:  Normal mood and affect. Normal behavior. Normal judgment and thought content.   Assessment and Plan:  Pregnancy: [id number] at [redacted]w[redacted]d  1. [redacted] weeks gestation of pregnancy   2. Pre-existing type 2 diabetes mellitus during pregnancy in third trimester FBS: 60-95 PPBS: 115-125  Good blood sugar control on metformin Pt had growth scan at [facility], EFW  40% Continue weekly BPP  3. Unwanted fertility Pt advised tubal ligation will need to be prepaid prior to repeat c section, fee of $1170  4. Supervision of high risk pregnancy, antepartum Continue routine prenatal care  5. Language barrier Live interpreter present  6. History of VBAC   7. Grand multipara   8. AMA (advanced maternal age) multigravida 35+, second trimester   Preterm labor symptoms and general obstetric precautions including but not limited to vaginal bleeding, contractions, leaking of fluid and fetal movement were reviewed in detail with the patient.  Please refer to After Visit Summary for other counseling recommendations.   Return in about 2 weeks (around 05/02/2023) for [facility], in person.   Mariel Aloe, MD Faculty Attending Center for [facility]

## 2023-04-19 DIAGNOSIS — O24113 Pre-existing diabetes mellitus, type 2, in pregnancy, third trimester: Secondary | ICD-10-CM | POA: Diagnosis not present

## 2023-04-19 NOTE — Progress Notes (Unsigned)
Patient was seen for BPP, completed by ultrasound tech. 8/8

## 2023-04-23 IMAGING — CT CT HEAD W/O CM
3 of 4 series · 15 of 47 positions shown, 18 images · non-contrast
Comparison: 2121

CLINICAL DATA: Headache, classic migraine headache for 3 weeks



[Series 3: head 5.0 h30s · axial · 0.42mm/px · z∈[-136,-1]mm · 9 of 33 slices shown, 12 images]
[im 3/33  brain]
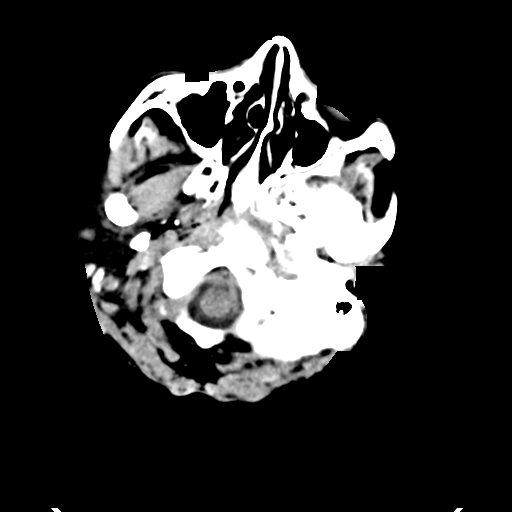
[im 3/33  bone]
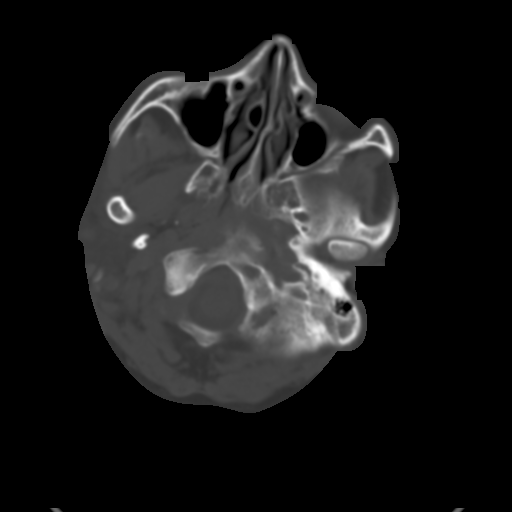
[im 7/33  brain]
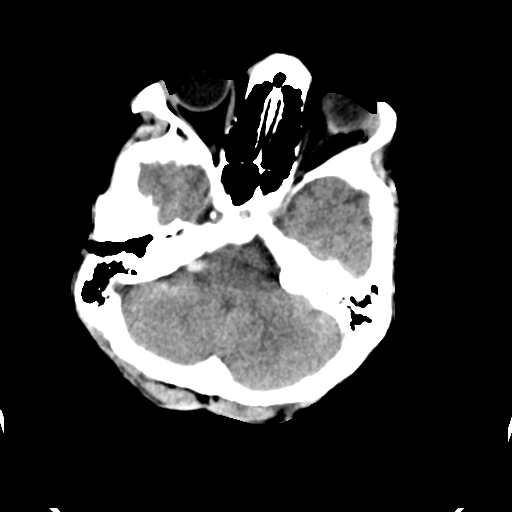
[im 10/33  brain]
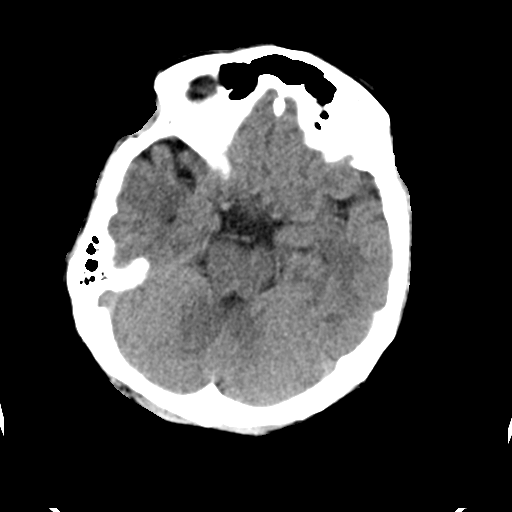
[im 14/33  brain]
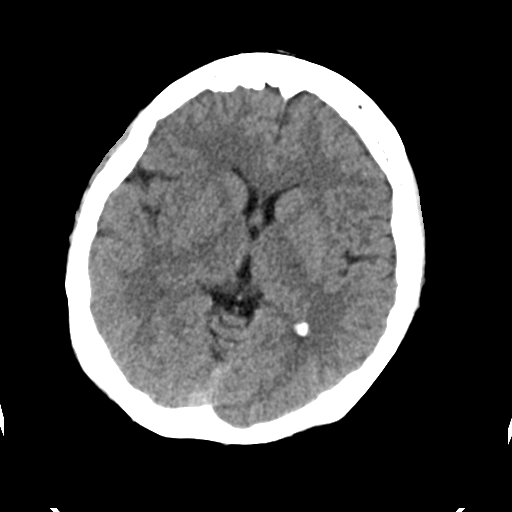
[im 17/33  brain]
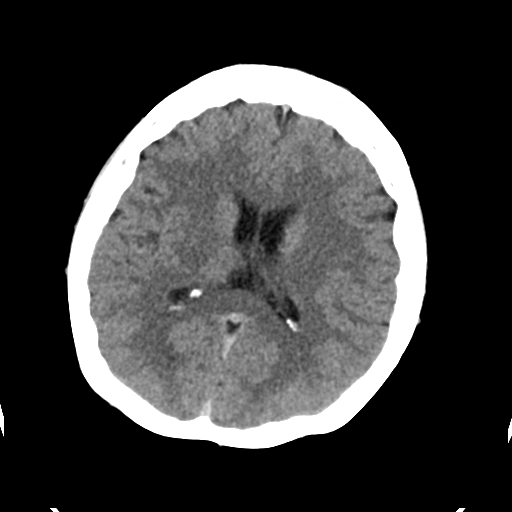
[im 17/33  bone]
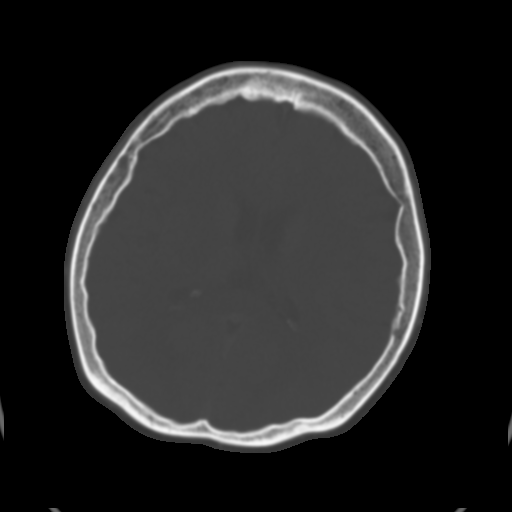
[im 19/33  brain]
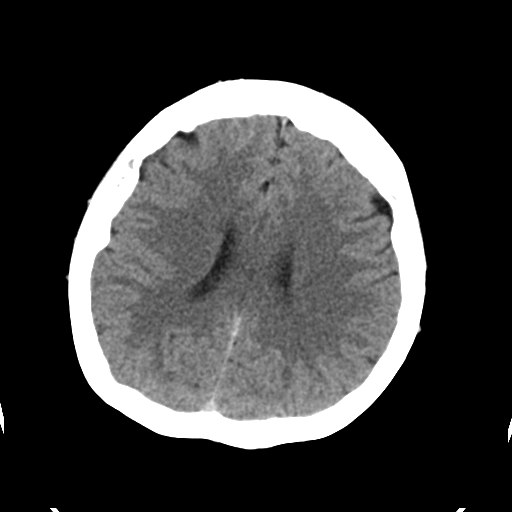
[im 23/33  brain]
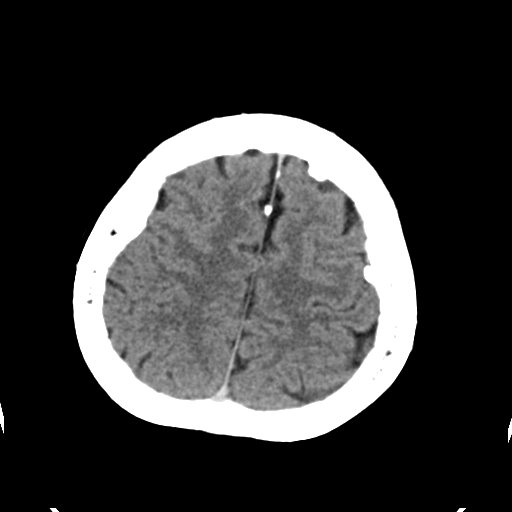
[im 26/33  brain]
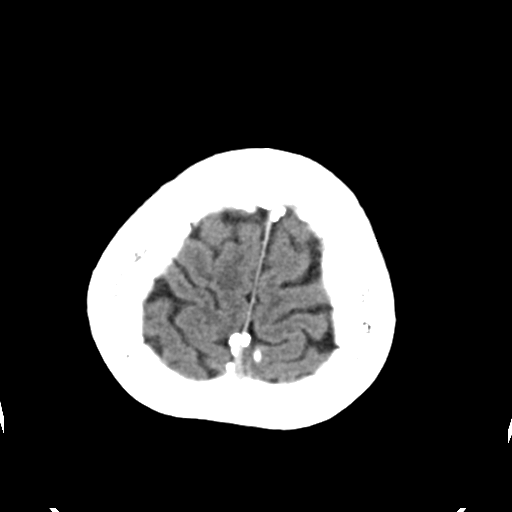
[im 30/33  brain]
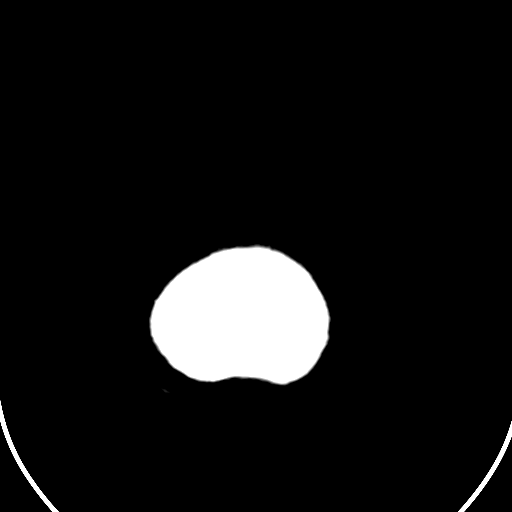
[im 30/33  bone]
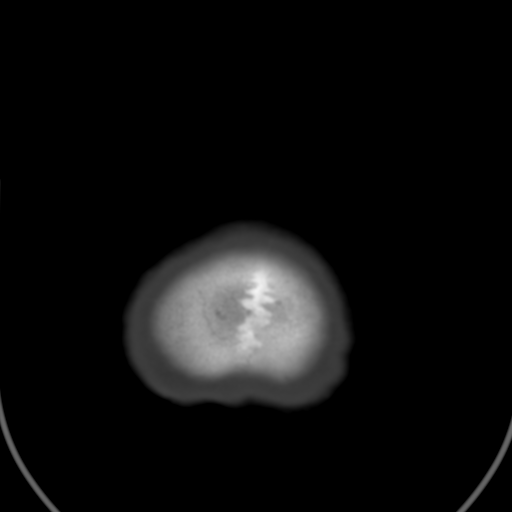

[Series 5: head 3.0 mpr cor · coronal · 0.35mm/px · 3 of 67 slices shown]
[im 23/67  brain]
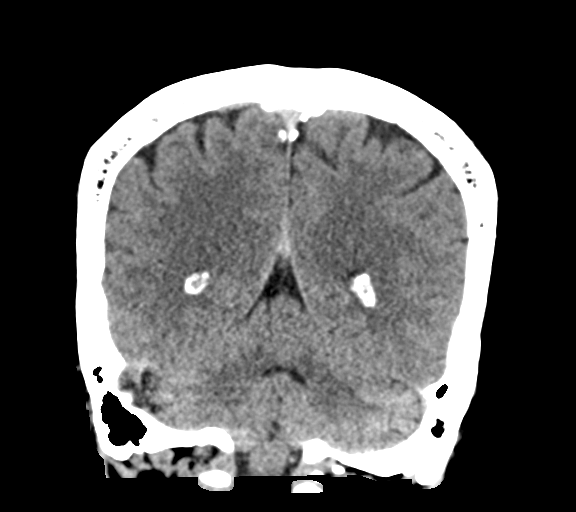
[im 30/67  brain]
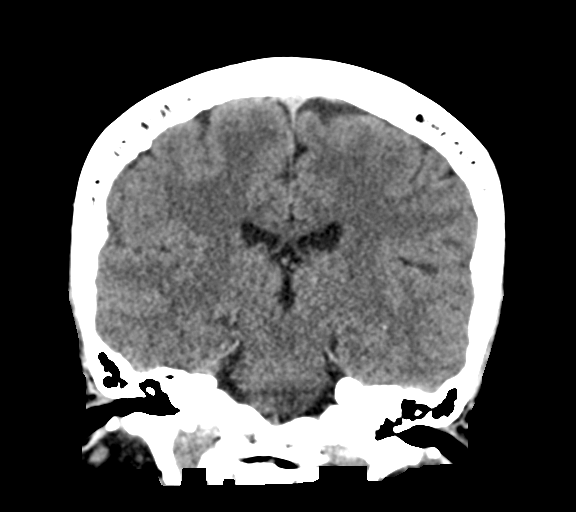
[im 37/67  brain]
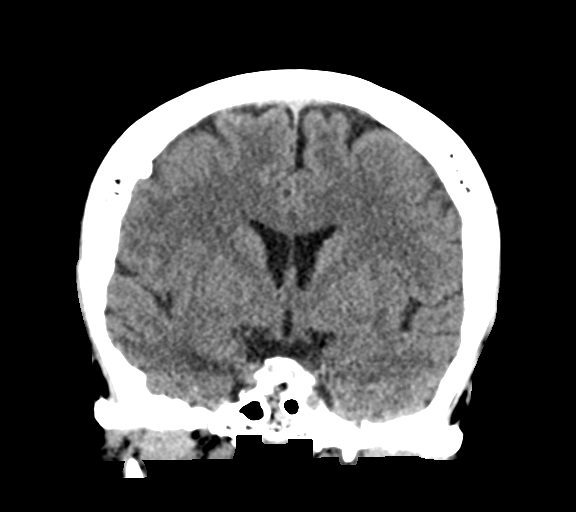

[Series 6: head 3.0 mpr sag · sagittal · 0.32mm/px · 3 of 67 slices shown]
[im 26/67  brain]
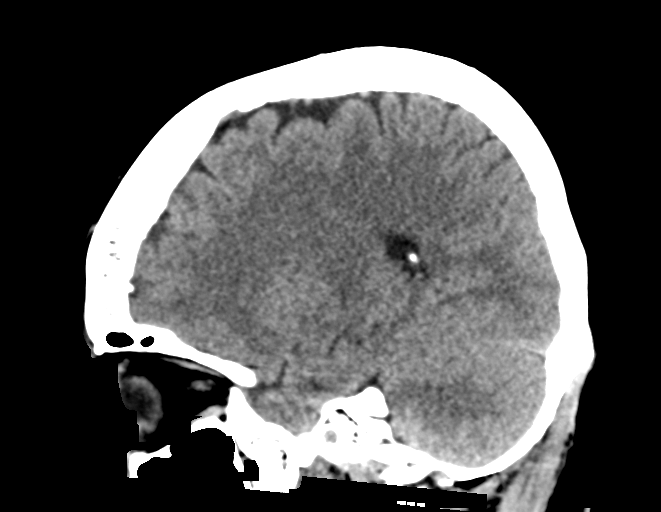
[im 34/67  brain]
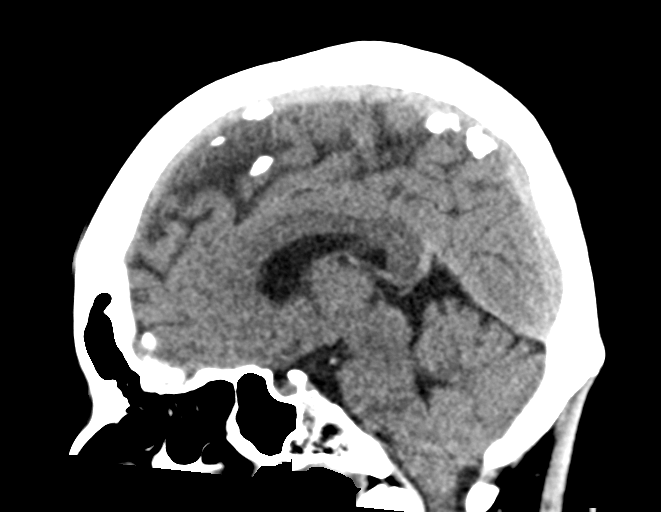
[im 42/67  brain]
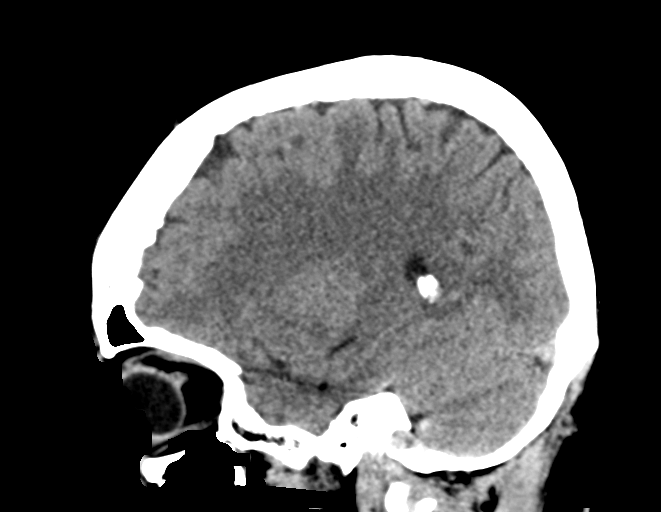

[15 of 47 positions shown; findings below may reference images not displayed]

FINDINGS: Brain: There is no acute intracranial hemorrhage, mass effect, or
edema. Gray-white differentiation is preserved. There is no
extra-axial fluid collection. Ventricles and sulci are within normal
limits in size and configuration.

Vascular: No hyperdense vessel or unexpected calcification.

Skull: Calvarium is unremarkable.

Sinuses/Orbits: No acute finding.

Other: None.
IMPRESSION: No acute intracranial abnormality.

## 2023-04-27 ENCOUNTER — Encounter: Payer: Self-pay | Admitting: Obstetrics and Gynecology

## 2023-04-27 ENCOUNTER — Ambulatory Visit (INDEPENDENT_AMBULATORY_CARE_PROVIDER_SITE_OTHER): Payer: Self-pay | Admitting: Obstetrics and Gynecology

## 2023-04-27 VITALS — BP 100/62 | HR 73 | Wt 126.4 lb

## 2023-04-27 DIAGNOSIS — Z98891 History of uterine scar from previous surgery: Secondary | ICD-10-CM

## 2023-04-27 DIAGNOSIS — O09523 Supervision of elderly multigravida, third trimester: Secondary | ICD-10-CM

## 2023-04-27 DIAGNOSIS — Z641 Problems related to multiparity: Secondary | ICD-10-CM

## 2023-04-27 DIAGNOSIS — Z3A33 33 weeks gestation of pregnancy: Secondary | ICD-10-CM

## 2023-04-27 DIAGNOSIS — Z603 Acculturation difficulty: Secondary | ICD-10-CM

## 2023-04-27 DIAGNOSIS — O0990 Supervision of high risk pregnancy, unspecified, unspecified trimester: Secondary | ICD-10-CM

## 2023-04-27 DIAGNOSIS — O24113 Pre-existing diabetes mellitus, type 2, in pregnancy, third trimester: Secondary | ICD-10-CM

## 2023-04-27 DIAGNOSIS — O0993 Supervision of high risk pregnancy, unspecified, third trimester: Secondary | ICD-10-CM

## 2023-04-27 DIAGNOSIS — R310 Gross hematuria: Secondary | ICD-10-CM

## 2023-04-27 DIAGNOSIS — Z758 Other problems related to medical facilities and other health care: Secondary | ICD-10-CM

## 2023-04-27 DIAGNOSIS — O09522 Supervision of elderly multigravida, second trimester: Secondary | ICD-10-CM

## 2023-04-27 MED ORDER — METFORMIN HCL 500 MG PO TABS
ORAL_TABLET | ORAL | Status: AC
Start: 2023-04-27 — End: ?

## 2023-04-27 NOTE — Progress Notes (Signed)
   PRENATAL VISIT NOTE  Subjective:  Angelica [name] is a 37 y.o. [phone] at 103w6d being seen today for ongoing prenatal care.  She is currently monitored for the following issues for this high-risk pregnancy and has Language barrier; Pre-existing type 2 diabetes mellitus during pregnancy; Grand multipara; History of classical cesarean section; Supervision of high risk pregnancy, antepartum; AMA (advanced maternal age) multigravida 35+, second trimester; Unwanted fertility; and Gross hematuria on their problem list.  Patient reports no complaints.  Contractions: Irritability. Vag. Bleeding: None.  Movement: Present. Denies leaking of fluid.   The following portions of the patient's history were reviewed and updated as appropriate: allergies, current medications, past family history, past medical history, past social history, past surgical history and problem list.   Objective:   Vitals:   04/27/23 0821  BP: 100/62  Pulse: 73  Weight: 126 lb 6.4 oz (57.3 kg)    Fetal Status: Fetal Heart Rate (bpm): 125   Movement: Present     General:  Alert, oriented and cooperative. Patient is in no acute distress.  Skin: Skin is warm and dry. No rash noted.   Cardiovascular: Normal heart rate noted  Respiratory: Normal respiratory effort, no problems with respiration noted  Abdomen: Soft, gravid, appropriate for gestational age.  Pain/Pressure: Present     Pelvic: Cervical exam deferred        Extremities: Normal range of motion.  Edema: None  Mental Status: Normal mood and affect. Normal behavior. Normal judgment and thought content.   Assessment and Plan:  Pregnancy: [id number] at [redacted]w[redacted]d 1. [redacted] weeks gestation of pregnancy Still desires btl. D/w her re: permanency of procedure, does not affect periods, etc.   2. Pre-existing type 2 diabetes mellitus during pregnancy in third trimester Patient states on metformin 500 with breakfast and 500 with dinner She has normal AM fasting and low 120s  after breakfast and lunch. I recommend she increase to metformin 500 two tabs with breakfast and stay with one with dinner and to check after dinner numbers, too.  I confirmed with pt re: her 6/21 11am [facility] u/s appt Pt not set up for bpp today. Will set up for next week 6/6: fetal echo wnl 6/5: 8/8, afi 10.6 5/24: 40%, 1663g - metFORMIN (GLUCOPHAGE) 500 MG tablet; 2 tabs with breakfast and 1 tab with dinner  3. History of classical cesarean section Scheduled for 7/8  4. AMA (advanced maternal age) multigravida 35+, second trimester  5. Language barrier In person interpretreter used  6. Grand multipara  7. Gross hematuria Recommend cath/foley ua sample prior to c-section. Pt had this on 5/1 at [facility] visit  8. Supervision of high risk pregnancy, antepartum  Preterm labor symptoms and general obstetric precautions including but not limited to vaginal bleeding, contractions, leaking of fluid and fetal movement were reviewed in detail with the patient. Please refer to After Visit Summary for other counseling recommendations.   Return for needs bpp with [place] on monday and every week thereafter and hrob with md.  Future Appointments  Date Time Provider Department Center  05/07/2023  2:35 PM [place] Bing, MD [facility]  05/14/2023  1:15 PM Adam Phenix, MD [facility]  05/21/2023  1:15 PM [place] Large, Jethro Bastos, MD [facility]  05/29/2023  1:15 PM Adam Phenix, MD [facility]    [place] Bing, MD

## 2023-04-30 ENCOUNTER — Other Ambulatory Visit: Payer: Self-pay | Admitting: *Deleted

## 2023-04-30 ENCOUNTER — Other Ambulatory Visit: Payer: Self-pay

## 2023-04-30 DIAGNOSIS — O24113 Pre-existing diabetes mellitus, type 2, in pregnancy, third trimester: Secondary | ICD-10-CM

## 2023-05-02 ENCOUNTER — Ambulatory Visit (INDEPENDENT_AMBULATORY_CARE_PROVIDER_SITE_OTHER): Payer: Self-pay

## 2023-05-02 ENCOUNTER — Other Ambulatory Visit: Payer: Self-pay

## 2023-05-02 DIAGNOSIS — Z3A34 34 weeks gestation of pregnancy: Secondary | ICD-10-CM

## 2023-05-02 DIAGNOSIS — O24113 Pre-existing diabetes mellitus, type 2, in pregnancy, third trimester: Secondary | ICD-10-CM

## 2023-05-07 ENCOUNTER — Ambulatory Visit (INDEPENDENT_AMBULATORY_CARE_PROVIDER_SITE_OTHER): Payer: Self-pay

## 2023-05-07 ENCOUNTER — Other Ambulatory Visit (HOSPITAL_COMMUNITY)
Admission: RE | Admit: 2023-05-07 | Discharge: 2023-05-07 | Disposition: A | Discharge status: Home / Self Care | Payer: Self-pay | Source: Ambulatory Visit | Attending: Obstetrics and Gynecology | Admitting: Obstetrics and Gynecology

## 2023-05-07 ENCOUNTER — Other Ambulatory Visit: Payer: Self-pay | Admitting: *Deleted

## 2023-05-07 ENCOUNTER — Ambulatory Visit (INDEPENDENT_AMBULATORY_CARE_PROVIDER_SITE_OTHER): Payer: Self-pay | Admitting: Obstetrics and Gynecology

## 2023-05-07 VITALS — BP 99/63 | HR 90 | Wt 127.3 lb

## 2023-05-07 DIAGNOSIS — O0990 Supervision of high risk pregnancy, unspecified, unspecified trimester: Secondary | ICD-10-CM

## 2023-05-07 DIAGNOSIS — Z641 Problems related to multiparity: Secondary | ICD-10-CM

## 2023-05-07 DIAGNOSIS — O09522 Supervision of elderly multigravida, second trimester: Secondary | ICD-10-CM

## 2023-05-07 DIAGNOSIS — Z3A35 35 weeks gestation of pregnancy: Secondary | ICD-10-CM

## 2023-05-07 DIAGNOSIS — O24113 Pre-existing diabetes mellitus, type 2, in pregnancy, third trimester: Secondary | ICD-10-CM

## 2023-05-07 DIAGNOSIS — Z3009 Encounter for other general counseling and advice on contraception: Secondary | ICD-10-CM

## 2023-05-07 DIAGNOSIS — O0993 Supervision of high risk pregnancy, unspecified, third trimester: Secondary | ICD-10-CM

## 2023-05-07 DIAGNOSIS — O09523 Supervision of elderly multigravida, third trimester: Secondary | ICD-10-CM

## 2023-05-07 DIAGNOSIS — R310 Gross hematuria: Secondary | ICD-10-CM

## 2023-05-07 DIAGNOSIS — Z603 Acculturation difficulty: Secondary | ICD-10-CM

## 2023-05-07 DIAGNOSIS — Z758 Other problems related to medical facilities and other health care: Secondary | ICD-10-CM

## 2023-05-07 DIAGNOSIS — Z98891 History of uterine scar from previous surgery: Secondary | ICD-10-CM

## 2023-05-07 NOTE — Progress Notes (Signed)
     PRENATAL VISIT NOTE  Subjective:  Kassie Keng is a 37 y.o. [phone] at [redacted]w[redacted]d being seen today for ongoing prenatal care.  She is currently monitored for the following issues for this high-risk pregnancy and has Language barrier; Pre-existing type 2 diabetes mellitus during pregnancy; Grand multipara; History of classical cesarean section; Supervision of high risk pregnancy, antepartum; AMA (advanced maternal age) multigravida 35+, second trimester; Unwanted fertility; and Gross hematuria on their problem list.  Patient reports no complaints.  Contractions: Irritability. Vag. Bleeding: None.  Movement: Present. Denies leaking of fluid.   The following portions of the patient's history were reviewed and updated as appropriate: allergies, current medications, past family history, past medical history, past social history, past surgical history and problem list.   Objective:   Vitals:   05/07/23 1532  BP: 99/63  Pulse: 90  Weight: 127 lb 4.8 oz (57.7 kg)    Fetal Status: Fetal Heart Rate (bpm): 158   Movement: Present     General:  Alert, oriented and cooperative. Patient is in no acute distress.  Skin: Skin is warm and dry. No rash noted.   Cardiovascular: Normal heart rate noted  Respiratory: Normal respiratory effort, no problems with respiration noted  Abdomen: Soft, gravid, appropriate for gestational age.  Pain/Pressure: Present     Pelvic: Cervical exam deferred        Extremities: Normal range of motion.  Edema: None  Mental Status: Normal mood and affect. Normal behavior. Normal judgment and thought content.   Assessment and Plan:  Pregnancy: [id number] at [redacted]w[redacted]d 1. Supervision of high risk pregnancy, antepartum Adopt a mom patient. Routine care - Culture, beta strep (group b only) - GC/Chlamydia probe amp (Maish Vaya)not at [facility]  2. Pre-existing type 2 diabetes mellitus during pregnancy in third trimester Patient on metformin 500 with breakfast and 500 with  dinner CBGs normal AM fasting and a few post prandials in the low 120s Bpp today 8/8, continue qwk testing 6/21 [place] u/s: efw 42%, 2498gm, ac wnl, afi 9.9 6/6: fetal echo wnl  3. History of classical cesarean section Pt scheduled for 7/8 repeat c-section  4. AMA (advanced maternal age) multigravida 35+, second trimester  No issues  5. Grand multipara  6. Unwanted fertility Patient making BTL payments. I told her that we will still do her BTL even if she is still making payments as long as she's certain she wants a BTL. R/b/a d/w her and she still desires a BTL  7. Language barrier In person interpreter used  8. History of gross hematuria in problem list No current s/s. Send foley sample from OR  Preterm labor symptoms and general obstetric precautions including but not limited to vaginal bleeding, contractions, leaking of fluid and fetal movement were reviewed in detail with the patient. Please refer to After Visit Summary for other counseling recommendations.   No follow-ups on file.  Future Appointments  Date Time Provider Department Center  05/07/2023  4:15 PM [facility] US2 [facility]  05/14/2023  1:15 PM Adam Phenix, MD [facility]  05/14/2023  2:15 PM [facility] US2 [facility]  05/21/2023  1:15 PM Anyanwu, Jethro Bastos, MD [facility]  05/21/2023  2:15 PM [facility] US2 [facility]  05/29/2023  1:15 PM Adam Phenix, MD [facility]    [place] Bing, MD

## 2023-05-08 LAB — GC/CHLAMYDIA PROBE AMP (~~LOC~~) NOT AT ARMC
Chlamydia: NEGATIVE
Comment: NEGATIVE
Comment: NORMAL
Neisseria Gonorrhea: NEGATIVE

## 2023-05-10 ENCOUNTER — Telehealth (HOSPITAL_COMMUNITY): Payer: Self-pay | Admitting: *Deleted

## 2023-05-10 LAB — CULTURE, BETA STREP (GROUP B ONLY): Strep Gp B Culture: NEGATIVE

## 2023-05-10 NOTE — Telephone Encounter (Signed)
Preadmission screen  

## 2023-05-10 NOTE — Pre-Procedure Instructions (Signed)
Preadmission screen interpreter number [phone]

## 2023-05-10 NOTE — Patient Instructions (Signed)
Kuuipo Anzaldo  05/10/2023   Your procedure is scheduled on:  05/21/2023  Arrive at 0730 at [employer] [facility] on [address] at [facility]  and [employer]. You are invited to use the FREE valet parking or use the Visitor's parking deck.  Pick up the phone at the desk and dial [phone].  Call this number if you have problems the morning of surgery: [phone]  Remember:   Do not eat food:(After Midnight) Desps de medianoche.  Do not drink clear liquids: (After Midnight) Desps de medianoche.  Take these medicines the morning of surgery with A SIP OF WATER:  none                                        Instrucciones Para Antes de la Ciruga   Su ciruga est programada para 05/21/2023  (your procedure is scheduled on) Entre por la entrada principal del [facility]  a las 0730 de la [place] -(enter through the main entrance at [facility] at [employer] AM    [employer] telfono, Wing [phone] para informarnos de su llegada. (pick up phone, dial [phone] on arrival)     Por favor llame al [phone] si tiene algn problema en la maana de la ciruga (please call this number if you have any problems the morning of surgery.)                  Recuerde: (Remember)  No coma alimentos. (Do not eat food (After Midnight) Desps de medianoche)    No tome lquidos claros. (Do not drink clear liquids (After Midnight) Desps de medianoche)    No use joyas, maquillaje de ojos, lpiz labial, crema para el cuerpo o esmalte de uas oscuro. (Do not wear jewelry, eye makeup, lipstick, body lotion, or dark fingernail polish). Puede usar desodorante (you may wear deodorant)    No se afeite 48 horas de su ciruga. (Do not shave 48 hours before your surgery)    No traiga objetos de valor al hospital.  [facility] no se hace responsable de ninguna pertenencia, ni objetos de valor que haya trado al hospital. (Do not bring valuable to the hospital.  [facility] is not responsible for  any belongings or valuables brought to the hospital)   [facility] medicinas en la maana de la ciruga con un SORBITO de agua nada (take these meds the morning of surgery with a SIP of water)     Durante la ciruga no se pueden usar lentes de contacto, dentaduras o puentes. (Contacts, dentures or bridgework cannot be worn in surgery).   Si va a ser ingresado despus de la ciruga, deje la [employer] en el carro hasta que se le haya asignado una habitacin. (If you are to be admitted after surgery, leave suitcase in car until your room has been assigned.)   A los pacientes que se les d de alta el mismo da no se les permitir manejar a casa.  (Patients discharged on the day of surgery will not be allowed to drive home)    [place] y nmero de telfono del [employer] na. (Name and telephone number of your driver)   Instrucciones especiales Shower using CHG 2 nights before surgery and the night before surgery.  If you shower the day of surgery use CHG.  Use special wash - you have one bottle of CHG for all showers.  You should use approximately 1/3 of the bottle for each shower. (Special Instructions)   Por favor, lea las hojas informativas que le entregaron. (Please read over the following fact sheets that you were given) Surgical Site Infection Prevention    Do not wear jewelry, make-up or nail polish.  Do not wear lotions, powders, or perfumes. Do not wear deodorant.  Do not shave 48 hours prior to surgery.  Do not bring valuables to the hospital.  [facility] is not   responsible for any belongings or valuables brought to the hospital.  Contacts, dentures or bridgework may not be worn into surgery.  Leave suitcase in the car. After surgery it may be brought to your room.  For patients admitted to the hospital, checkout time is 11:00 AM the day of              discharge.      Please read over the following fact sheets that you were given:     Preparing for Surgery

## 2023-05-11 ENCOUNTER — Telehealth (HOSPITAL_COMMUNITY): Payer: Self-pay | Admitting: *Deleted

## 2023-05-11 NOTE — Pre-Procedure Instructions (Signed)
Interpreter number [phone]

## 2023-05-11 NOTE — Telephone Encounter (Signed)
Preadmission screen  

## 2023-05-14 ENCOUNTER — Ambulatory Visit (INDEPENDENT_AMBULATORY_CARE_PROVIDER_SITE_OTHER): Payer: Self-pay | Admitting: Obstetrics & Gynecology

## 2023-05-14 ENCOUNTER — Ambulatory Visit (INDEPENDENT_AMBULATORY_CARE_PROVIDER_SITE_OTHER): Payer: Medicaid Other

## 2023-05-14 ENCOUNTER — Encounter (HOSPITAL_COMMUNITY): Payer: Self-pay

## 2023-05-14 ENCOUNTER — Other Ambulatory Visit: Payer: Self-pay | Admitting: General Practice

## 2023-05-14 VITALS — BP 94/56 | HR 77 | Wt 129.0 lb

## 2023-05-14 DIAGNOSIS — Z3A36 36 weeks gestation of pregnancy: Secondary | ICD-10-CM | POA: Diagnosis not present

## 2023-05-14 DIAGNOSIS — Z3009 Encounter for other general counseling and advice on contraception: Secondary | ICD-10-CM

## 2023-05-14 DIAGNOSIS — O24113 Pre-existing diabetes mellitus, type 2, in pregnancy, third trimester: Secondary | ICD-10-CM

## 2023-05-14 DIAGNOSIS — Z98891 History of uterine scar from previous surgery: Secondary | ICD-10-CM

## 2023-05-14 NOTE — Pre-Procedure Instructions (Signed)
Preadmission [phone] interpreter number

## 2023-05-14 NOTE — Progress Notes (Signed)
   PRENATAL VISIT NOTE  Subjective:  Angelica [name] is a 37 y.o. [phone] at [redacted]w[redacted]d being seen today for ongoing prenatal care.  She is currently monitored for the following issues for this high-risk pregnancy and has Language barrier; Pre-existing type 2 diabetes mellitus during pregnancy; Grand multipara; History of classical cesarean section; Supervision of high risk pregnancy, antepartum; AMA (advanced maternal age) multigravida 35+, second trimester; Unwanted fertility; and Gross hematuria on their problem list.  Patient reports no complaints.   .  .  Movement: Present. Denies leaking of fluid.   The following portions of the patient's history were reviewed and updated as appropriate: allergies, current medications, past family history, past medical history, past social history, past surgical history and problem list.   Objective:   Vitals:   05/14/23 1312  BP: (!) 94/56  Pulse: 77  Weight: 129 lb (58.5 kg)    Fetal Status: Fetal Heart Rate (bpm): 132   Movement: Present     General:  Alert, oriented and cooperative. Patient is in no acute distress.  Skin: Skin is warm and dry. No rash noted.   Cardiovascular: Normal heart rate noted  Respiratory: Normal respiratory effort, no problems with respiration noted  Abdomen: Soft, gravid, appropriate for gestational age.  Pain/Pressure: Present     Pelvic: Cervical exam deferred        Extremities: Normal range of motion.     Mental Status: Normal mood and affect. Normal behavior. Normal judgment and thought content.   Assessment and Plan:  Pregnancy: [id number] at [redacted]w[redacted]d 1. History of classical cesarean section Repeat CS in 7 days  2. Pre-existing type 2 diabetes mellitus during pregnancy in third trimester Excellent control  3. Unwanted fertility Wants BTL  Preterm labor symptoms and general obstetric precautions including but not limited to vaginal bleeding, contractions, leaking of fluid and fetal movement were reviewed in  detail with the patient. Please refer to After Visit Summary for other counseling recommendations.   Return if symptoms worsen or fail to improve.  Future Appointments  Date Time Provider Department Center  05/14/2023  2:15 PM [facility]2 [facility]  05/18/2023 10:30 AM [facility] PAT 1 [facility] None    Scheryl Darter, MD

## 2023-05-18 ENCOUNTER — Encounter (HOSPITAL_COMMUNITY)
Admission: RE | Admit: 2023-05-18 | Discharge: 2023-05-18 | Disposition: A | Discharge status: Home / Self Care | Payer: Self-pay | Source: Ambulatory Visit | Attending: Family Medicine | Admitting: Family Medicine

## 2023-05-18 DIAGNOSIS — Z3009 Encounter for other general counseling and advice on contraception: Secondary | ICD-10-CM

## 2023-05-18 DIAGNOSIS — Z01812 Encounter for preprocedural laboratory examination: Secondary | ICD-10-CM | POA: Insufficient documentation

## 2023-05-18 HISTORY — DX: Gestational diabetes mellitus in pregnancy, unspecified control: O24.419

## 2023-05-18 LAB — CBC
HCT: 37 % (ref 36.0–46.0)
Hemoglobin: 12.3 g/dL (ref 12.0–15.0)
MCH: 28 pg (ref 26.0–34.0)
MCHC: 33.2 g/dL (ref 30.0–36.0)
MCV: 84.1 fL (ref 80.0–100.0)
Platelets: 308 10*3/uL (ref 150–400)
RBC: 4.4 MIL/uL (ref 3.87–5.11)
RDW: 13.1 % (ref 11.5–15.5)
WBC: 8.1 10*3/uL (ref 4.0–10.5)
nRBC: 0 % (ref 0.0–0.2)

## 2023-05-18 LAB — COMPREHENSIVE METABOLIC PANEL
ALT: 14 U/L (ref 0–44)
AST: 19 U/L (ref 15–41)
Albumin: 2.9 g/dL — ABNORMAL LOW (ref 3.5–5.0)
Alkaline Phosphatase: 209 U/L — ABNORMAL HIGH (ref 38–126)
Anion gap: 12 (ref 5–15)
BUN: 7 mg/dL (ref 6–20)
CO2: 20 mmol/L — ABNORMAL LOW (ref 22–32)
Calcium: 8.8 mg/dL — ABNORMAL LOW (ref 8.9–10.3)
Chloride: 100 mmol/L (ref 98–111)
Creatinine, Ser: 0.58 mg/dL (ref 0.44–1.00)
GFR, Estimated: >60 mL/min (ref >60)
Glucose, Bld: 177 mg/dL — ABNORMAL HIGH (ref 70–99)
Potassium: 3.7 mmol/L (ref 3.5–5.1)
Sodium: 132 mmol/L — ABNORMAL LOW (ref 135–145)
Total Bilirubin: 0.5 mg/dL (ref 0.3–1.2)
Total Protein: 6.9 g/dL (ref 6.5–8.1)

## 2023-05-18 LAB — TYPE AND SCREEN
ABO/RH(D): B POS
Antibody Screen: NEGATIVE

## 2023-05-18 LAB — RPR: RPR Ser Ql: NONREACTIVE

## 2023-05-20 ENCOUNTER — Encounter (HOSPITAL_COMMUNITY): Payer: Self-pay | Admitting: Family Medicine

## 2023-05-21 ENCOUNTER — Other Ambulatory Visit: Payer: Self-pay

## 2023-05-21 ENCOUNTER — Inpatient Hospital Stay (HOSPITAL_COMMUNITY): Payer: Medicaid Other | Admitting: Anesthesiology

## 2023-05-21 ENCOUNTER — Encounter (HOSPITAL_COMMUNITY)
Admission: RE | Disposition: A | Discharge status: Home / Self Care | Payer: Self-pay | Source: Home / Self Care | Attending: Family Medicine

## 2023-05-21 ENCOUNTER — Encounter: Payer: Self-pay | Admitting: Obstetrics & Gynecology

## 2023-05-21 ENCOUNTER — Inpatient Hospital Stay (HOSPITAL_COMMUNITY)
Admission: RE | Admit: 2023-05-21 | Discharge: 2023-05-23 | DRG: 786 | Disposition: A | Discharge status: Home / Self Care | Payer: Medicaid Other | Attending: Family Medicine | Admitting: Family Medicine

## 2023-05-21 ENCOUNTER — Encounter (HOSPITAL_COMMUNITY): Payer: Self-pay | Admitting: Family Medicine

## 2023-05-21 DIAGNOSIS — Z98891 History of uterine scar from previous surgery: Secondary | ICD-10-CM

## 2023-05-21 DIAGNOSIS — Z7984 Long term (current) use of oral hypoglycemic drugs: Secondary | ICD-10-CM

## 2023-05-21 DIAGNOSIS — O34211 Maternal care for low transverse scar from previous cesarean delivery: Secondary | ICD-10-CM | POA: Diagnosis not present

## 2023-05-21 DIAGNOSIS — O2412 Pre-existing diabetes mellitus, type 2, in childbirth: Secondary | ICD-10-CM | POA: Diagnosis present

## 2023-05-21 DIAGNOSIS — R310 Gross hematuria: Secondary | ICD-10-CM | POA: Diagnosis present

## 2023-05-21 DIAGNOSIS — O24119 Pre-existing diabetes mellitus, type 2, in pregnancy, unspecified trimester: Secondary | ICD-10-CM | POA: Diagnosis present

## 2023-05-21 DIAGNOSIS — Z3A37 37 weeks gestation of pregnancy: Secondary | ICD-10-CM

## 2023-05-21 DIAGNOSIS — O09523 Supervision of elderly multigravida, third trimester: Secondary | ICD-10-CM | POA: Diagnosis not present

## 2023-05-21 DIAGNOSIS — O09522 Supervision of elderly multigravida, second trimester: Secondary | ICD-10-CM | POA: Diagnosis present

## 2023-05-21 DIAGNOSIS — Z641 Problems related to multiparity: Secondary | ICD-10-CM

## 2023-05-21 DIAGNOSIS — Z7982 Long term (current) use of aspirin: Secondary | ICD-10-CM | POA: Diagnosis not present

## 2023-05-21 DIAGNOSIS — O9902 Anemia complicating childbirth: Secondary | ICD-10-CM | POA: Diagnosis present

## 2023-05-21 DIAGNOSIS — O24424 Gestational diabetes mellitus in childbirth, insulin controlled: Secondary | ICD-10-CM | POA: Diagnosis not present

## 2023-05-21 DIAGNOSIS — O24113 Pre-existing diabetes mellitus, type 2, in pregnancy, third trimester: Secondary | ICD-10-CM | POA: Diagnosis not present

## 2023-05-21 DIAGNOSIS — Z3009 Encounter for other general counseling and advice on contraception: Secondary | ICD-10-CM | POA: Diagnosis present

## 2023-05-21 DIAGNOSIS — Z758 Other problems related to medical facilities and other health care: Secondary | ICD-10-CM | POA: Diagnosis present

## 2023-05-21 DIAGNOSIS — O0990 Supervision of high risk pregnancy, unspecified, unspecified trimester: Secondary | ICD-10-CM

## 2023-05-21 LAB — GLUCOSE, CAPILLARY
Glucose-Capillary: 106 mg/dL — ABNORMAL HIGH (ref 70–99)
Glucose-Capillary: 113 mg/dL — ABNORMAL HIGH (ref 70–99)
Glucose-Capillary: 118 mg/dL — ABNORMAL HIGH (ref 70–99)
Glucose-Capillary: 115 mg/dL — ABNORMAL HIGH (ref 70–99)

## 2023-05-21 SURGERY — Surgical Case
Anesthesia: Spinal

## 2023-05-21 MED ORDER — SCOPOLAMINE 1 MG/3DAYS TD PT72: 1.0000 | MEDICATED_PATCH | Freq: Once | TRANSDERMAL | Status: DC

## 2023-05-21 MED ORDER — COCONUT OIL OIL: 1.0000 | TOPICAL_OIL | Status: DC | PRN

## 2023-05-21 MED ORDER — PHENYLEPHRINE HCL-NACL 20-0.9 MG/250ML-% IV SOLN
INTRAVENOUS | Status: DC | PRN
  Administered 2023-05-21: 60 ug/min via INTRAVENOUS

## 2023-05-21 MED ORDER — ONDANSETRON HCL 4 MG/2ML IJ SOLN
INTRAMUSCULAR | Status: AC
  Filled 2023-05-21: qty 2

## 2023-05-21 MED ORDER — KETOROLAC TROMETHAMINE 30 MG/ML IJ SOLN
30.0000 mg | Freq: Four times a day (QID) | INTRAMUSCULAR | Status: AC
  Administered 2023-05-21 – 2023-05-22 (×4): 30 mg via INTRAVENOUS
  Filled 2023-05-21 (×4): qty 1

## 2023-05-21 MED ORDER — DIBUCAINE (PERIANAL) 1 % EX OINT: 1.0000 | TOPICAL_OINTMENT | CUTANEOUS | Status: DC | PRN

## 2023-05-21 MED ORDER — OXYTOCIN-SODIUM CHLORIDE 30-0.9 UT/500ML-% IV SOLN
INTRAVENOUS | Status: AC
  Filled 2023-05-21: qty 500

## 2023-05-21 MED ORDER — METFORMIN HCL 500 MG PO TABS
1000.0000 mg | ORAL_TABLET | Freq: Two times a day (BID) | ORAL | Status: DC
  Administered 2023-05-21 – 2023-05-23 (×4): 1000 mg via ORAL
  Filled 2023-05-21 (×4): qty 2

## 2023-05-21 MED ORDER — SOD CITRATE-CITRIC ACID 500-334 MG/5ML PO SOLN
30.0000 mL | ORAL | Status: AC
  Administered 2023-05-21: 30 mL via ORAL

## 2023-05-21 MED ORDER — SIMETHICONE 80 MG PO CHEW
80.0000 mg | CHEWABLE_TABLET | Freq: Three times a day (TID) | ORAL | Status: DC
  Administered 2023-05-21 – 2023-05-23 (×5): 80 mg via ORAL
  Filled 2023-05-21 (×5): qty 1

## 2023-05-21 MED ORDER — BUPIVACAINE IN DEXTROSE 0.75-8.25 % IT SOLN
INTRATHECAL | Status: DC | PRN
  Administered 2023-05-21: 1.8 mL via INTRATHECAL

## 2023-05-21 MED ORDER — IBUPROFEN 600 MG PO TABS
600.0000 mg | ORAL_TABLET | Freq: Four times a day (QID) | ORAL | Status: DC
  Administered 2023-05-22 – 2023-05-23 (×4): 600 mg via ORAL
  Filled 2023-05-21 (×4): qty 1

## 2023-05-21 MED ORDER — DEXAMETHASONE SODIUM PHOSPHATE 10 MG/ML IJ SOLN
INTRAMUSCULAR | Status: DC | PRN
  Administered 2023-05-21: 4 mg via INTRAVENOUS

## 2023-05-21 MED ORDER — OXYTOCIN-SODIUM CHLORIDE 30-0.9 UT/500ML-% IV SOLN
2.5000 [IU]/h | INTRAVENOUS | Status: AC
  Filled 2023-05-21: qty 500

## 2023-05-21 MED ORDER — LACTATED RINGERS IV SOLN: INTRAVENOUS | Status: DC

## 2023-05-21 MED ORDER — DEXAMETHASONE SODIUM PHOSPHATE 4 MG/ML IJ SOLN
INTRAMUSCULAR | Status: AC
  Filled 2023-05-21: qty 1

## 2023-05-21 MED ORDER — WITCH HAZEL-GLYCERIN EX PADS: 1.0000 | MEDICATED_PAD | CUTANEOUS | Status: DC | PRN

## 2023-05-21 MED ORDER — FENTANYL CITRATE (PF) 100 MCG/2ML IJ SOLN
INTRAMUSCULAR | Status: DC | PRN
  Administered 2023-05-21: 15 ug via INTRATHECAL

## 2023-05-21 MED ORDER — HYDROMORPHONE HCL 1 MG/ML IJ SOLN: 0.2000 mg | INTRAMUSCULAR | Status: DC | PRN

## 2023-05-21 MED ORDER — HYDROCODONE-ACETAMINOPHEN 5-325 MG PO TABS: 1.0000 | ORAL_TABLET | ORAL | Status: DC | PRN

## 2023-05-21 MED ORDER — KETOROLAC TROMETHAMINE 30 MG/ML IJ SOLN
30.0000 mg | Freq: Once | INTRAMUSCULAR | Status: AC | PRN
  Administered 2023-05-21: 30 mg via INTRAVENOUS

## 2023-05-21 MED ORDER — MENTHOL 3 MG MT LOZG: 1.0000 | LOZENGE | OROMUCOSAL | Status: DC | PRN

## 2023-05-21 MED ORDER — NALOXONE HCL 0.4 MG/ML IJ SOLN: 0.4000 mg | INTRAMUSCULAR | Status: DC | PRN

## 2023-05-21 MED ORDER — KETOROLAC TROMETHAMINE 30 MG/ML IJ SOLN
INTRAMUSCULAR | Status: AC
  Filled 2023-05-21: qty 1

## 2023-05-21 MED ORDER — DIPHENHYDRAMINE HCL 25 MG PO CAPS: 25.0000 mg | ORAL_CAPSULE | Freq: Four times a day (QID) | ORAL | Status: DC | PRN

## 2023-05-21 MED ORDER — OXYTOCIN-SODIUM CHLORIDE 30-0.9 UT/500ML-% IV SOLN
INTRAVENOUS | Status: DC | PRN
  Administered 2023-05-21: 300 mL via INTRAVENOUS

## 2023-05-21 MED ORDER — ENOXAPARIN SODIUM 40 MG/0.4ML IJ SOSY
40.0000 mg | PREFILLED_SYRINGE | INTRAMUSCULAR | Status: DC
  Administered 2023-05-21 – 2023-05-22 (×2): 40 mg via SUBCUTANEOUS
  Filled 2023-05-21 (×2): qty 0.4

## 2023-05-21 MED ORDER — MORPHINE SULFATE (PF) 0.5 MG/ML IJ SOLN
INTRAMUSCULAR | Status: AC
  Filled 2023-05-21: qty 10

## 2023-05-21 MED ORDER — NALOXONE HCL 4 MG/10ML IJ SOLN: 1.0000 ug/kg/h | INTRAVENOUS | Status: DC | PRN

## 2023-05-21 MED ORDER — STERILE WATER FOR IRRIGATION IR SOLN
Status: DC | PRN
  Administered 2023-05-21: 1000 mL

## 2023-05-21 MED ORDER — DIPHENHYDRAMINE HCL 50 MG/ML IJ SOLN: 12.5000 mg | INTRAMUSCULAR | Status: DC | PRN

## 2023-05-21 MED ORDER — ACETAMINOPHEN 500 MG PO TABS
1000.0000 mg | ORAL_TABLET | Freq: Four times a day (QID) | ORAL | Status: AC
  Administered 2023-05-21 – 2023-05-22 (×2): 1000 mg via ORAL
  Filled 2023-05-21 (×2): qty 2

## 2023-05-21 MED ORDER — SCOPOLAMINE 1 MG/3DAYS TD PT72
1.0000 | MEDICATED_PATCH | TRANSDERMAL | Status: DC
  Administered 2023-05-21: 1.5 mg via TRANSDERMAL

## 2023-05-21 MED ORDER — POLYETHYLENE GLYCOL 3350 17 G PO PACK
17.0000 g | PACK | Freq: Every day | ORAL | Status: DC
  Administered 2023-05-22: 17 g via ORAL
  Filled 2023-05-21: qty 1

## 2023-05-21 MED ORDER — ONDANSETRON HCL 4 MG/2ML IJ SOLN: 4.0000 mg | Freq: Three times a day (TID) | INTRAMUSCULAR | Status: DC | PRN

## 2023-05-21 MED ORDER — PHENYLEPHRINE HCL-NACL 20-0.9 MG/250ML-% IV SOLN
INTRAVENOUS | Status: AC
  Filled 2023-05-21: qty 250

## 2023-05-21 MED ORDER — CEFAZOLIN SODIUM-DEXTROSE 2-4 GM/100ML-% IV SOLN
2.0000 g | INTRAVENOUS | Status: AC
  Administered 2023-05-21: 2 g via INTRAVENOUS

## 2023-05-21 MED ORDER — SOD CITRATE-CITRIC ACID 500-334 MG/5ML PO SOLN
ORAL | Status: AC
  Filled 2023-05-21: qty 30

## 2023-05-21 MED ORDER — POVIDONE-IODINE 10 % EX SWAB
2.0000 | Freq: Once | CUTANEOUS | Status: AC
  Administered 2023-05-21: 2 via TOPICAL

## 2023-05-21 MED ORDER — FENTANYL CITRATE (PF) 100 MCG/2ML IJ SOLN: 25.0000 ug | INTRAMUSCULAR | Status: DC | PRN

## 2023-05-21 MED ORDER — DIPHENHYDRAMINE HCL 25 MG PO CAPS: 25.0000 mg | ORAL_CAPSULE | ORAL | Status: DC | PRN

## 2023-05-21 MED ORDER — SENNOSIDES-DOCUSATE SODIUM 8.6-50 MG PO TABS
2.0000 | ORAL_TABLET | ORAL | Status: DC
  Administered 2023-05-22: 2 via ORAL
  Filled 2023-05-21: qty 2

## 2023-05-21 MED ORDER — FENTANYL CITRATE (PF) 100 MCG/2ML IJ SOLN
INTRAMUSCULAR | Status: AC
  Filled 2023-05-21: qty 2

## 2023-05-21 MED ORDER — SODIUM CHLORIDE 0.9% FLUSH: 3.0000 mL | INTRAVENOUS | Status: DC | PRN

## 2023-05-21 MED ORDER — SCOPOLAMINE 1 MG/3DAYS TD PT72
MEDICATED_PATCH | TRANSDERMAL | Status: AC
  Filled 2023-05-21: qty 1

## 2023-05-21 MED ORDER — KETOROLAC TROMETHAMINE 30 MG/ML IJ SOLN: 30.0000 mg | Freq: Four times a day (QID) | INTRAMUSCULAR | Status: AC | PRN

## 2023-05-21 MED ORDER — ONDANSETRON HCL 4 MG/2ML IJ SOLN
4.0000 mg | Freq: Once | INTRAMUSCULAR | Status: AC
  Administered 2023-05-21: 4 mg via INTRAVENOUS

## 2023-05-21 MED ORDER — CEFAZOLIN SODIUM-DEXTROSE 2-4 GM/100ML-% IV SOLN
INTRAVENOUS | Status: AC
  Filled 2023-05-21: qty 100

## 2023-05-21 MED ORDER — ONDANSETRON HCL 4 MG/2ML IJ SOLN
INTRAMUSCULAR | Status: DC | PRN
  Administered 2023-05-21: 4 mg via INTRAVENOUS

## 2023-05-21 MED ORDER — PRENATAL MULTIVITAMIN CH
1.0000 | ORAL_TABLET | Freq: Every day | ORAL | Status: DC
  Administered 2023-05-22 – 2023-05-23 (×2): 1 via ORAL
  Filled 2023-05-21 (×2): qty 1

## 2023-05-21 MED ORDER — MORPHINE SULFATE (PF) 0.5 MG/ML IJ SOLN
INTRAMUSCULAR | Status: DC | PRN
  Administered 2023-05-21: 150 ug via INTRATHECAL

## 2023-05-21 MED ORDER — TRANEXAMIC ACID-NACL 1000-0.7 MG/100ML-% IV SOLN
1000.0000 mg | Freq: Once | INTRAVENOUS | Status: AC
  Administered 2023-05-21: 1000 mg via INTRAVENOUS

## 2023-05-21 MED ORDER — SIMETHICONE 80 MG PO CHEW: 80.0000 mg | CHEWABLE_TABLET | ORAL | Status: DC | PRN

## 2023-05-21 MED ORDER — TRANEXAMIC ACID-NACL 1000-0.7 MG/100ML-% IV SOLN
INTRAVENOUS | Status: AC
  Filled 2023-05-21: qty 100

## 2023-05-21 SURGICAL SUPPLY — 35 items
ADH SKN CLS LQ APL DERMABOND (GAUZE/BANDAGES/DRESSINGS) ×1
APL PRP STRL LF DISP 70% ISPRP (MISCELLANEOUS) ×2
CHLORAPREP W/TINT 26 (MISCELLANEOUS) ×2 IMPLANT
CLAMP UMBILICAL CORD (MISCELLANEOUS) ×1 IMPLANT
CLOTH BEACON ORANGE TIMEOUT ST (SAFETY) ×1 IMPLANT
DERMABOND ADVANCED .7 DNX6 (GAUZE/BANDAGES/DRESSINGS) IMPLANT
DRSG OPSITE POSTOP 4X10 (GAUZE/BANDAGES/DRESSINGS) ×1 IMPLANT
ELECT REM PT RETURN 9FT ADLT (ELECTROSURGICAL) ×1
ELECTRODE REM PT RTRN 9FT ADLT (ELECTROSURGICAL) ×1 IMPLANT
EXTRACTOR VACUUM KIWI (MISCELLANEOUS) IMPLANT
GAUZE SPONGE 4X4 12PLY STRL LF (GAUZE/BANDAGES/DRESSINGS) IMPLANT
GLOVE BIOGEL PI IND STRL 7.0 (GLOVE) ×2 IMPLANT
GLOVE ECLIPSE 7.0 STRL STRAW (GLOVE) ×1 IMPLANT
GOWN STRL REUS W/TWL LRG LVL3 (GOWN DISPOSABLE) ×2 IMPLANT
KIT ABG SYR 3ML LUER SLIP (SYRINGE) ×1 IMPLANT
MAT PREVALON FULL STRYKER (MISCELLANEOUS) IMPLANT
NDL HYPO 25X5/8 SAFETYGLIDE (NEEDLE) ×1 IMPLANT
NEEDLE HYPO 25X5/8 SAFETYGLIDE (NEEDLE) ×1 IMPLANT
NS IRRIG 1000ML POUR BTL (IV SOLUTION) ×1 IMPLANT
PACK C SECTION WH (CUSTOM PROCEDURE TRAY) ×1 IMPLANT
PAD OB MATERNITY 4.3X12.25 (PERSONAL CARE ITEMS) ×1 IMPLANT
RTRCTR C-SECT PINK 25CM LRG (MISCELLANEOUS) ×1 IMPLANT
SUT MNCRL 0 VIOLET CTX 36 (SUTURE) ×1 IMPLANT
SUT PLAIN 0 NONE (SUTURE) IMPLANT
SUT PLAIN 2 0 (SUTURE)
SUT PLAIN 2 0 XLH (SUTURE) IMPLANT
SUT PLAIN ABS 2-0 CT1 27XMFL (SUTURE) IMPLANT
SUT VIC AB 0 CTX 36 (SUTURE) ×1
SUT VIC AB 0 CTX36XBRD ANBCTRL (SUTURE) ×1 IMPLANT
SUT VIC AB 2-0 CT1 27 (SUTURE)
SUT VIC AB 2-0 CT1 TAPERPNT 27 (SUTURE) IMPLANT
SUT VIC AB 4-0 KS 27 (SUTURE) ×1 IMPLANT
TOWEL OR 17X24 6PK STRL BLUE (TOWEL DISPOSABLE) ×1 IMPLANT
TRAY FOLEY W/BAG SLVR 14FR LF (SET/KITS/TRAYS/PACK) IMPLANT
WATER STERILE IRR 1000ML POUR (IV SOLUTION) ×1 IMPLANT

## 2023-05-21 NOTE — Op Note (Signed)
Operative Note   Patient: Angelica [name]  Date of Procedure: 05/21/2023  Procedure: Repeat Low Transverse Cesarean   Indications: previous uterine incision: low transverse with J-extension  Pre-operative Diagnosis: Repeat Cesarean Section with Bilateral Tubal Ligation.   Post-operative Diagnosis: Same  TOLAC Candidate: No  Surgeon: Surgeon(s) and Role:    * Madelina Sanda, Mary Sella, MD - Primary    * Mercado-Ortiz, Lahoma Crocker, DO - Assisting  Assistants: Dr. Laurie Panda, visiting resident  An experienced assistant was required given the standard of surgical care given the complexity of the case.  This assistant was needed for exposure, dissection, suctioning, retraction, instrument exchange, assisting with delivery with administration of fundal pressure, and for overall help during the procedure.   Anesthesia: spinal  Anesthesiologist: Elmer Picker, MD   Antibiotics: Cefazolin   Estimated Blood Loss: 517 ml   Total IV Fluids: 2200 ml  Urine Output:  500 cc OF clear urine  Specimens: none   Complications: no complications   Indications: Angelica [name] is a 37 y.o. [phone] with an IUP [redacted]w[redacted]d presenting for scheduled cesarean secondary to the indications listed above.  The risks of cesarean section discussed with the patient included but were not limited to: bleeding which may require transfusion or reoperation; infection which may require antibiotics; injury to bowel, bladder, ureters or other surrounding organs; injury to the fetus; need for additional procedures including hysterectomy in the event of a life-threatening hemorrhage; placental abnormalities with subsequent pregnancies, incisional problems, thromboembolic phenomenon and other postoperative/anesthesia complications. The patient concurred with the proposed plan, giving informed written consent for the procedure. Patient has been NPO since last night she will remain NPO for procedure. Anesthesia and OR  aware. Preoperative prophylactic antibiotics and SCDs ordered on call to the OR.   Findings: Viable infant in cephalic presentation, nuchal x1. Apgars 8 , 9 , . Weight 3330 g . Clear amniotic fluid. Normal placenta, three vessel cord. Normal uterus, Normal bilateral fallopian tubes, Normal bilateral ovaries. Minimal adhesive disease.  Procedure Details: A Time Out was held and the above information confirmed. The patient received intravenous antibiotics and had sequential compression devices applied to her lower extremities preoperatively. The patient was taken back to the operative suite where spinal anesthesia was administered. After induction of anesthesia, the patient was draped and prepped in the usual sterile manner and placed in a dorsal supine position with a leftward tilt. A low transverse skin incision was made with scalpel and carried down through the subcutaneous tissue to the fascia. Fascial incision was made and extended transversely. The fascia was separated from the underlying rectus tissue superiorly and inferiorly. The rectus muscles were separated in the midline bluntly and the peritoneum was entered bluntly. An Alexis retractor was placed to aid in visualization of the uterus. A bladder flap was not developed. A low transverse uterine incision was made. The infant was successfully delivered from cephalic presentation, the umbilical cord was clamped after 1 minute. Cord ph was not sent, and cord blood was obtained for evaluation. The placenta was removed Intact and appeared normal. The uterine incision was closed with a single layer running unlocked suture of 0-Monocryl. Overall, excellent hemostasis was noted. The abdomen and the pelvis were cleared of all clot and debris and the Jon Gills was removed. Hemostasis was confirmed on all surfaces.  The peritoneum was reapproximated using 2-0 vicryl . The fascia was then closed using 0 Vicryl in a running fashion. The subcutaneous layer was not  reapproximated. The skin  was closed with a 4-0 vicryl subcuticular stitch. The patient tolerated the procedure well. Sponge, lap, instrument and needle counts were correct x 2. She was taken to the recovery room in stable condition.  Disposition: PACU - hemodynamically stable.    Signed: Venora Maples, MD/MPH Attending Family Medicine Physician, [facility] for [facility], [facility]

## 2023-05-21 NOTE — Progress Notes (Signed)
Went to see a pt  for interpretation services with Willette Alma MD, explained anesthesia plan, by Orlan Leavens Spanish Medical Interpreter.

## 2023-05-21 NOTE — Anesthesia Preprocedure Evaluation (Signed)
Anesthesia Evaluation  Patient identified by MRN, date of birth, ID band Patient awake    Reviewed: Allergy & Precautions, NPO status , Patient's Chart, lab work & pertinent test results  Airway Mallampati: II  TM Distance: >3 FB Neck ROM: Full    Dental no notable dental hx.    Pulmonary neg pulmonary ROS   Pulmonary exam normal breath sounds clear to auscultation       Cardiovascular negative cardio ROS Normal cardiovascular exam Rhythm:Regular Rate:Normal     Neuro/Psych negative neurological ROS  negative psych ROS   GI/Hepatic negative GI ROS, Neg liver ROS,,,  Endo/Other  diabetes, Type 2, Oral Hypoglycemic Agents    Renal/GU negative Renal ROS  negative genitourinary   Musculoskeletal negative musculoskeletal ROS (+)    Abdominal   Peds  Hematology negative hematology ROS (+)   Anesthesia Other Findings   Reproductive/Obstetrics (+) Pregnancy                             Anesthesia Physical Anesthesia Plan  ASA: 2  Anesthesia Plan: Spinal   Post-op Pain Management:    Induction:   PONV Risk Score and Plan: Treatment may vary due to age or medical condition  Airway Management Planned: Natural Airway  Additional Equipment:   Intra-op Plan:   Post-operative Plan:   Informed Consent: I have reviewed the patients History and Physical, chart, labs and discussed the procedure including the risks, benefits and alternatives for the proposed anesthesia with the patient or authorized representative who has indicated his/her understanding and acceptance.     Dental advisory given  Plan Discussed with: CRNA  Anesthesia Plan Comments:        Anesthesia Quick Evaluation

## 2023-05-21 NOTE — Discharge Summary (Shared)
Postpartum Discharge Summary  Date of Service updated***     Patient Name: Angelica [name] DOB: Apr 17, 1986 MRN: [id number]  Date of admission: 05/21/2023 Delivery date:05/21/2023  Delivering provider: Venora Maples  Date of discharge: 05/21/2023  Admitting diagnosis: RCS Undesired Fertility Intrauterine pregnancy: [redacted]w[redacted]d     Secondary diagnosis:  Active Problems:   Language barrier   Pre-existing type 2 diabetes mellitus during pregnancy   Grand multipara   History of classical cesarean section   Supervision of high risk pregnancy, antepartum   AMA (advanced maternal age) multigravida 35+, second trimester   Unwanted fertility   Gross hematuria  Additional problems: ***    Discharge diagnosis: Term Pregnancy Delivered and Type 2 DM                                              Post partum procedures:{Postpartum procedures:23558} Augmentation: N/A Complications: {OB Labor/Delivery Complications:20784}  Hospital course: Sceduled C/S   37 y.o. yo [phone] at [redacted]w[redacted]d was admitted to the hospital 05/21/2023 for scheduled cesarean section with the following indication: two priors, J incision with last cesarean .Delivery details are as follows:  Membrane Rupture Time/Date: 11:01 AM ,05/21/2023   Delivery Method:C-Section, Low Transverse  Details of operation can be found in separate operative note.  Patient had a postpartum course complicated by***.  She is ambulating, tolerating a regular diet, passing flatus, and urinating well. Patient is discharged home in stable condition on  05/21/23        Newborn Data: Birth date:05/21/2023  Birth time:11:02 AM  Gender:Female  Living status:Living  Apgars:8 ,9  Weight:3330 g     Magnesium Sulfate received: No BMZ received: No Rhophylac:N/A MMR:N/A T-DaP:Given prenatally Flu: N/A Transfusion:{Transfusion received:30440034}  Physical exam  Vitals:   05/21/23 0820  BP: 99/77  Pulse: 75  Resp: 18  Temp: 98.6 F (37 C)  TempSrc: Oral   SpO2: 98%  Weight: 59 kg  Height: 4\' 9"  (1.448 m)   General: {Exam; general:21111117} Lochia: {Desc; appropriate/inappropriate:30686::"appropriate"} Uterine Fundus: {Desc; firm/soft:30687} Incision: {Exam; incision:21111123} DVT Evaluation: {Exam; ZHY:8657846} Labs: Lab Results  Component Value Date   WBC 8.1 05/18/2023   HGB 12.3 05/18/2023   HCT 37.0 05/18/2023   MCV 84.1 05/18/2023   PLT 308 05/18/2023      Latest Ref Rng & Units 05/18/2023   10:28 AM  CMP  Glucose 70 - 99 mg/dL 962   BUN 6 - 20 mg/dL 7   Creatinine 9.52 - 8.41 mg/dL 3.24   Sodium 401 - 027 mmol/L 132   Potassium 3.5 - 5.1 mmol/L 3.7   Chloride 98 - 111 mmol/L 100   CO2 22 - 32 mmol/L 20   Calcium 8.9 - 10.3 mg/dL 8.8   Total Protein 6.5 - 8.1 g/dL 6.9   Total Bilirubin 0.3 - 1.2 mg/dL 0.5   Alkaline Phos 38 - 126 U/L 209   AST 15 - 41 U/L 19   ALT 0 - 44 U/L 14    Edinburgh Score:    07/20/2020    8:05 PM  Edinburgh Postnatal Depression Scale Screening Tool  I have been able to laugh and see the funny side of things. 0  I have looked forward with enjoyment to things. 0  I have blamed myself unnecessarily when things went wrong. 0  I have been anxious or worried for no good reason.  0  I have felt scared or panicky for no good reason. 0  Things have been getting on top of me. 0  I have been so unhappy that I have had difficulty sleeping. 0  I have felt sad or miserable. 0  I have been so unhappy that I have been crying. 0  The thought of harming myself has occurred to me. 0  Edinburgh Postnatal Depression Scale Total 0     After visit meds:  Allergies as of 05/21/2023   No Known Allergies   Med Rec must be completed prior to using this [facility]***        Discharge home in stable condition Infant Feeding: Bottle and Breast Infant Disposition:home with mother Discharge instruction: per After Visit Summary and Postpartum booklet. Activity: Advance as tolerated. Pelvic rest for 6  weeks.  Diet: carb modified diet Future Appointments:No future appointments. Follow up Visit:   Please schedule this patient for a In person postpartum visit in 4 weeks with the following provider: Any provider. Additional Postpartum F/U:Incision check 1 week  High risk pregnancy complicated by:  T2DM, grand multiparity Delivery mode:  C-Section, Low Transverse  Anticipated Birth Control:  Depo   05/21/2023 Venora Maples, MD

## 2023-05-21 NOTE — Anesthesia Procedure Notes (Signed)
Spinal  Patient location during procedure: OR Start time: 05/21/2023 10:39 AM End time: 05/21/2023 10:41 AM Reason for block: surgical anesthesia Staffing Performed: anesthesiologist  Anesthesiologist: Elmer Picker, MD Performed by: Elmer Picker, MD Authorized by: Elmer Picker, MD   Preanesthetic Checklist Completed: patient identified, IV checked, risks and benefits discussed, surgical consent, monitors and equipment checked, pre-op evaluation and timeout performed Spinal Block Patient position: sitting Prep: DuraPrep and site prepped and draped Patient monitoring: cardiac monitor, continuous pulse ox and blood pressure Approach: midline Location: L3-4 Injection technique: single-shot Needle Needle type: Pencan  Needle gauge: 24 G Needle length: 9 cm Assessment Sensory level: T6 Events: CSF return Additional Notes Functioning IV was confirmed and monitors were applied. Sterile prep and drape, including hand hygiene and sterile gloves were used. The patient was positioned and the spine was prepped. The skin was anesthetized with lidocaine.  Free flow of clear CSF was obtained prior to injecting local anesthetic into the CSF.  The spinal needle aspirated freely following injection.  The needle was carefully withdrawn.  The patient tolerated the procedure well.

## 2023-05-21 NOTE — H&P (Signed)
LABOR AND DELIVERY ADMISSION HISTORY AND PHYSICAL NOTE  Angelica [name] is a 37 y.o. female [phone] with IUP at [redacted]w[redacted]d presenting for repeat cesarean section.   Patient reports the fetal movement as active. Patient reports uterine contraction  activity as none. Patient reports  vaginal bleeding as none. Patient describes fluid per vagina as None.   She plans on breast feeding and bottle feeding feeding. Her contraception plan is: Depo-Provera.  Prenatal History/Complications: PNC at [facility]  Sono:  @[redacted]w[redacted]d , CWD, normal anatomy, transverse presentation, fundal placenta, 42%ile, EFW 2498g  Pregnancy complications:  Patient Active Problem List   Diagnosis Date Noted   Unwanted fertility 03/27/2023   Gross hematuria 03/27/2023   Supervision of high risk pregnancy, antepartum 01/09/2023   AMA (advanced maternal age) multigravida 35+, second trimester 01/09/2023   History of classical cesarean section 07/23/2020   Grand multipara 06/23/2020   Pre-existing type 2 diabetes mellitus during pregnancy 03/03/2020   Language barrier 02/23/2020    Past Medical History: Past Medical History:  Diagnosis Date   Abscess 01/14/2019   gluteal    Abscess and cellulitis of gluteal region 01/14/2019   DM (diabetes mellitus), type 2 (HCC)    Gestational diabetes    History of VBAC 02/23/2020   x5    Past Surgical History: Past Surgical History:  Procedure Laterality Date   CESAREAN SECTION     CESAREAN SECTION N/A 07/20/2020   Procedure: CESAREAN SECTION;  Surgeon: Conan Bowens, MD;  Location: [facility] LD ORS;  Service: Obstetrics;  Laterality: N/A;   CHOLECYSTECTOMY      Obstetrical History: OB History     Gravida  9   Para  8   Term  8   Preterm      AB      Living  8      SAB      IAB      Ectopic      Multiple  0   Live Births  8           Social History: Social History   Socioeconomic History   Marital status: Married    Spouse name: Not on  file   Number of children: Not on file   Years of education: Not on file   Highest education level: Not on file  Occupational History   Not on file  Tobacco Use   Smoking status: Never   Smokeless tobacco: Never  Vaping Use   Vaping Use: Never used  Substance and Sexual Activity   Alcohol use: Never   Drug use: Never   Sexual activity: Yes    Birth control/protection: None    Comment: withdrawal  Other Topics Concern   Not on file  Social History Narrative   Not on file   Social Determinants of Health   Financial Resource Strain: Not on file  Food Insecurity: Food Insecurity Present (01/16/2023)   Hunger Vital Sign    Worried About Running Out of Food in the Last Year: Sometimes true    Ran Out of Food in the Last Year: Sometimes true  Transportation Needs: No Transportation Needs (01/16/2023)   PRAPARE - Administrator, Civil Service (Medical): No    Lack of Transportation (Non-Medical): No  Physical Activity: Not on file  Stress: Not on file  Social Connections: Not on file    Family History: Family History  Problem Relation Age of Onset   Diabetes Mother     Allergies:  No Known Allergies  Medications Prior to Admission  Medication Sig Dispense Refill Last Dose   acetaminophen (TYLENOL) 500 MG tablet Take 2 tablets (1,000 mg total) by mouth every 6 (six) hours as needed for moderate pain. 30 tablet 0 More than a month   aspirin EC 81 MG tablet Take 1 tablet (81 mg total) by mouth daily. Swallow whole. 30 tablet 12 05/19/2023   metFORMIN (GLUCOPHAGE) 500 MG tablet 2 tabs with breakfast and 1 tab with dinner   05/19/2023   Prenatal Vit-Fe Fumarate-FA (PRENATAL VITAMINS) 28-0.8 MG TABS Take 1 tablet by mouth daily. 30 tablet 9 05/19/2023     Review of Systems  All systems reviewed and negative except as stated in HPI  Physical Exam BP 99/77   Pulse 75   Temp 98.6 F (37 C) (Oral)   Resp 18   Ht 4\' 9"  (1.448 m)   Wt 59 kg   LMP 09/02/2022    SpO2 98%   BMI 28.13 kg/m   Physical Exam Constitutional:      General: She is not in acute distress.    Appearance: Normal appearance. She is not ill-appearing.  HENT:     Head: Atraumatic.  Eyes:     General: No scleral icterus.    Conjunctiva/sclera: Conjunctivae normal.  Pulmonary:     Effort: Pulmonary effort is normal.  Skin:    General: Skin is warm and dry.     Coloration: Skin is not jaundiced or pale.  Neurological:     Mental Status: She is alert.     Coordination: Coordination normal.  Psychiatric:        Mood and Affect: Mood normal.        Behavior: Behavior normal.     FHR: 129 bpm  Prenatal labs: ABO, Rh: --/--/B POS (07/05 1032) Antibody: NEG (07/05 1032) Rubella: 5.20 (02/27 1127) RPR: NON REACTIVE (07/05 1028)  HBsAg: Negative (02/27 1127)  HIV: Non Reactive (05/01 1051)  GC/Chlamydia:  Neisseria Gonorrhea  Date Value Ref Range Status  05/07/2023 Negative  Final   Chlamydia  Date Value Ref Range Status  05/07/2023 Negative  Final   GBS: Negative/-- (06/24 1608)  Prenatal Transfer Tool  Maternal Diabetes: Yes:  Diabetes Type:  Insulin/Medication controlled Genetic Screening: Normal Maternal Ultrasounds/Referrals: Normal Fetal Ultrasounds or other Referrals:  None Maternal Substance Abuse:  No Significant Maternal Medications:  Meds include: Other: Metformin, ASA Significant Maternal Lab Results: Group B Strep negative  Results for orders placed or performed during the hospital encounter of 05/21/23 (from the past 24 hour(s))  Glucose, capillary   Collection Time: 05/21/23  8:28 AM  Result Value Ref Range   Glucose-Capillary 106 (H) 70 - 99 mg/dL    Assessment: Angelica [name] is a 37 y.o. [id number] at [redacted]w[redacted]d here for scheduled cesarean section.  #Repeat Low Transverse Cesarean  The risks of cesarean section discussed with the patient included but were not limited to: bleeding which may require transfusion or reoperation; infection  which may require antibiotics; injury to bowel, bladder, ureters or other surrounding organs; injury to the fetus; need for additional procedures including hysterectomy in the event of a life-threatening hemorrhage; placental abnormalities with subsequent pregnancies, incisional problems, thromboembolic phenomenon and other postoperative/anesthesia complications. The patient concurred with the proposed plan, giving informed written consent for the procedure. Patient has been NPO since last night she will remain NPO for procedure. Anesthesia and OR aware. Preoperative prophylactic antibiotics and SCDs ordered on call to the OR.   #  Anesthesia: spinal #FWB: FHR 129 bpm #GBS/ID: Negative #MOF: breast feeding and bottle feeding #MOC:  Counseled at length given grand multiparity. Discussed in private after asking her husband to step out. After much discussion she declines BTL. Plans on Depo Provera after delivery. #Circ: No  #Type 2 DM: check sugars QID, continue metformin postpartum  Venora Maples, MD/MPH Attending Family Medicine Physician, [facility] for [name], [facility]  05/21/2023, 9:50 AM

## 2023-05-21 NOTE — Progress Notes (Signed)
I assisted RN in 5th floor to do all the admission paperwork and explain the plan of care to pt,by Orlan Leavens [employer].

## 2023-05-21 NOTE — Transfer of Care (Signed)
Immediate Anesthesia Transfer of Care Note  Patient: Angelica [name]  Procedure(s) Performed: CESAREAN SECTION  Patient Location: PACU  Anesthesia Type:Spinal  Level of Consciousness: awake  Airway & Oxygen Therapy: Patient Spontanous Breathing  Post-op Assessment: Report given to RN and Post -op Vital signs reviewed and stable  Post vital signs: Reviewed and stable  Last Vitals:  Vitals Value Taken Time  BP 99/60 05/21/23 1155  Temp    Pulse 67 05/21/23 1157  Resp 17 05/21/23 1157  SpO2 98 % 05/21/23 1157  Vitals shown include unvalidated device data.  Last Pain:  Vitals:   05/21/23 0820  TempSrc: Oral  PainSc: 0-No pain         Complications: No notable events documented.

## 2023-05-22 ENCOUNTER — Encounter (HOSPITAL_COMMUNITY): Payer: Self-pay | Admitting: Family Medicine

## 2023-05-22 LAB — URINE CULTURE: Culture: NO GROWTH

## 2023-05-22 LAB — GLUCOSE, CAPILLARY
Glucose-Capillary: 88 mg/dL (ref 70–99)
Glucose-Capillary: 75 mg/dL (ref 70–99)
Glucose-Capillary: 114 mg/dL — ABNORMAL HIGH (ref 70–99)
Glucose-Capillary: 101 mg/dL — ABNORMAL HIGH (ref 70–99)

## 2023-05-22 LAB — CBC
HCT: 27.6 % — ABNORMAL LOW (ref 36.0–46.0)
Hemoglobin: 9.5 g/dL — ABNORMAL LOW (ref 12.0–15.0)
MCH: 28.4 pg (ref 26.0–34.0)
MCHC: 34.4 g/dL (ref 30.0–36.0)
MCV: 82.4 fL (ref 80.0–100.0)
Platelets: 242 10*3/uL (ref 150–400)
RBC: 3.35 MIL/uL — ABNORMAL LOW (ref 3.87–5.11)
RDW: 13 % (ref 11.5–15.5)
WBC: 11.6 10*3/uL — ABNORMAL HIGH (ref 4.0–10.5)
nRBC: 0 % (ref 0.0–0.2)

## 2023-05-22 LAB — BIRTH TISSUE RECOVERY COLLECTION (PLACENTA DONATION)

## 2023-05-22 MED ORDER — FERROUS SULFATE 325 (65 FE) MG PO TABS
325.0000 mg | ORAL_TABLET | ORAL | Status: DC
  Administered 2023-05-22: 325 mg via ORAL
  Filled 2023-05-22: qty 1

## 2023-05-22 MED ORDER — IRON SUCROSE 500 MG IVPB - SIMPLE MED
500.0000 mg | Freq: Once | INTRAVENOUS | Status: DC
  Filled 2023-05-22: qty 275

## 2023-05-22 MED ORDER — MEDROXYPROGESTERONE ACETATE 150 MG/ML IM SUSP: 150.0000 mg | Freq: Once | INTRAMUSCULAR | Status: DC

## 2023-05-22 NOTE — Plan of Care (Signed)
  Problem: Activity: Goal: Risk for activity intolerance will decrease Outcome: Progressing   Problem: Nutrition: Goal: Adequate nutrition will be maintained Outcome: Progressing   Problem: Coping: Goal: Level of anxiety will decrease Outcome: Progressing   Problem: Elimination: Goal: Will not experience complications related to bowel motility Outcome: Progressing Goal: Will not experience complications related to urinary retention Outcome: Progressing   Problem: Pain Managment: Goal: General experience of comfort will improve Outcome: Progressing   Problem: Safety: Goal: Ability to remain free from injury will improve Outcome: Progressing   Problem: Skin Integrity: Goal: Risk for impaired skin integrity will decrease Outcome: Progressing   Problem: Education: Goal: Ability to describe self-care measures that may prevent or decrease complications (Diabetes Survival Skills Education) will improve Outcome: Progressing Goal: Individualized Educational Video(s) Outcome: Progressing   Problem: Coping: Goal: Ability to adjust to condition or change in health will improve Outcome: Progressing   Problem: Fluid Volume: Goal: Ability to maintain a balanced intake and output will improve Outcome: Progressing   Problem: Health Behavior/Discharge Planning: Goal: Ability to identify and utilize available resources and services will improve Outcome: Progressing Goal: Ability to manage health-related needs will improve Outcome: Progressing   Problem: Metabolic: Goal: Ability to maintain appropriate glucose levels will improve Outcome: Progressing   Problem: Nutritional: Goal: Maintenance of adequate nutrition will improve Outcome: Progressing Goal: Progress toward achieving an optimal weight will improve Outcome: Progressing   Problem: Skin Integrity: Goal: Risk for impaired skin integrity will decrease Outcome: Progressing   Problem: Tissue Perfusion: Goal: Adequacy  of tissue perfusion will improve Outcome: Progressing   Problem: Education: Goal: Knowledge of the prescribed therapeutic regimen will improve Outcome: Progressing Goal: Understanding of sexual limitations or changes related to disease process or condition will improve Outcome: Progressing Goal: Individualized Educational Video(s) Outcome: Progressing   Problem: Self-Concept: Goal: Communication of feelings regarding changes in body function or appearance will improve Outcome: Progressing   Problem: Skin Integrity: Goal: Demonstration of wound healing without infection will improve Outcome: Progressing   Problem: Activity: Goal: Will verbalize the importance of balancing activity with adequate rest periods Outcome: Progressing Goal: Ability to tolerate increased activity will improve Outcome: Progressing   Problem: Coping: Goal: Ability to identify and utilize available resources and services will improve Outcome: Progressing   Problem: Life Cycle: Goal: Chance of risk for complications during the postpartum period will decrease Outcome: Progressing   Problem: Role Relationship: Goal: Ability to demonstrate positive interaction with newborn will improve Outcome: Progressing   Problem: Skin Integrity: Goal: Demonstration of wound healing without infection will improve Outcome: Progressing

## 2023-05-22 NOTE — Progress Notes (Addendum)
Postoperative PROGRESS NOTE  Subjective: Angelica [name] is a 37 y.o. [phone] s/p repeat LTCS at [redacted]w[redacted]d.  She reports she doing well. No acute events overnight. She denies any problems with ambulating, voiding or po intake. Denies nausea or vomiting, light headedness. Pain is well controlled.  Lochia is minimal, controlled per patient.  Objective: Blood pressure (!) 100/58, pulse (!) 58, temperature 98 F (36.7 C), temperature source Oral, resp. rate 16, height 4\' 9"  (1.448 m), weight 59 kg, last menstrual period 09/02/2022, SpO2 98 %, unknown if currently breastfeeding.  Physical Exam:  General: alert, cooperative and no distress Chest: no respiratory distress Abdomen: soft, non-tender. Incision is wrapped. Nontender to palpation. Uterine Fundus: firm and at level of umbilicus Extremities: No calf swelling or tenderness  No edema  Recent Labs    05/22/23 0529  HGB 9.5*  HCT 27.6*    Assessment/Plan: Postoperative day 1: Angelica [name] is a 37 y.o. [phone] s/p repeat LTCS at [redacted]w[redacted]d for scheduled cesarean section.  Routine Postpartum Care: Doing well, pain well-controlled.  -- Continue routine care, lactation support Anemia: Asymptomatic.  Hemoglobin dropped from 12.3 on 7/5  to 9.5 this morning.  Will add Venofer. T2DM: CBG well-controlled.  Continue metformin -- Contraception: Depo -- Feeding: Breast and Bottle  Dispo: Plan for discharge tomorrow, 05/23/23.  Kayleen Memos, Medical Student Faculty Practice, Center for [facility] 05/22/2023 7:15 AM ___ Flonnie Hailstone of Supervision of Student:  I confirm that I have verified the information documented in the medical student's note and that I have also personally performed the history, physical exam and all medical decision making activities.  I have verified that all services and findings are accurately documented in this student's note; and I agree with management and plan as outlined in the documentation. I have  also made any necessary editorial changes.  Myrtie Hawk, DO Center for [employer], [facility] 05/22/2023 3:19 PM

## 2023-05-22 NOTE — Lactation Note (Signed)
This note was copied from a baby's chart. Lactation Consultation Note  Patient Name: Angelica [name] [id number] Date: 05/22/2023 Age:37 hours  Reason for consult: Initial assessment;Maternal endocrine disorder;Early term 37-38.6wks  P9, [redacted]w[redacted]d GA, 6.31% weight loss  [facility] Spanish Interpreter, Erie Noe, present for interpretation. Mother has many years of breastfeeding experience with her children. She reports baby is breastfeeding well and she feeds him formula after breastfeeding. Upon entering room, baby was tolerating bottle feeding and took 35 ml.   Mother states she has never used or needed a breast pump in the past but would like a manual pump. Instruction given on use, cleaning and milk collection and storage. Handouts and LC services provided in Spanish.   Mother was instructed to request help if needed.   Maternal Data Does the patient have breastfeeding experience prior to this delivery?: Yes How long did the patient breastfeed?: 1-3 years  Feeding Mother's Current Feeding Choice: Breast Milk and Formula Nipple Type: Slow - flow  L Interventions Interventions: Education;LC Services brochure;Hand pump  Discharge Pump: Manual  Consult Status Consult Status: Complete    Omar Person 05/22/2023, 6:00 PM

## 2023-05-22 NOTE — Anesthesia Postprocedure Evaluation (Signed)
Anesthesia Post Note  Patient: Angelica [name]  Procedure(s) Performed: CESAREAN SECTION     Patient location during evaluation: PACU Anesthesia Type: Spinal Level of consciousness: oriented and awake and alert Pain management: pain level controlled Vital Signs Assessment: post-procedure vital signs reviewed and stable Respiratory status: spontaneous breathing, respiratory function stable and patient connected to nasal cannula oxygen Cardiovascular status: blood pressure returned to baseline and stable Postop Assessment: no headache, no backache and no apparent nausea or vomiting Anesthetic complications: no  No notable events documented.  Last Vitals:  Vitals:   05/21/23 2353 05/22/23 0410  BP: 101/61 (!) 100/58  Pulse: 61 (!) 58  Resp: 16 16  Temp: 36.8 C 36.7 C  SpO2: 99% 98%    Last Pain:  Vitals:   05/22/23 0830  TempSrc:   PainSc: 0-No pain                 Bristal Steffy L Hensley Treat

## 2023-05-23 LAB — GLUCOSE, CAPILLARY: Glucose-Capillary: 92 mg/dL (ref 70–99)

## 2023-05-23 MED ORDER — IBUPROFEN 600 MG PO TABS: 600.0000 mg | ORAL_TABLET | Freq: Four times a day (QID) | ORAL | 0 refills | Status: AC

## 2023-05-23 MED ORDER — PRENATAL MULTIVITAMIN CH: 1.0000 | ORAL_TABLET | Freq: Every day | ORAL | 0 refills | Status: DC

## 2023-05-23 MED ORDER — POLYETHYLENE GLYCOL 3350 17 G PO PACK: 17.0000 g | PACK | Freq: Every day | ORAL | 0 refills | Status: AC

## 2023-05-23 MED ORDER — FERROUS SULFATE 325 (65 FE) MG PO TABS: 325.0000 mg | ORAL_TABLET | ORAL | 3 refills | Status: AC

## 2023-05-23 MED ORDER — OXYCODONE HCL 5 MG PO TABS: 5.0000 mg | ORAL_TABLET | Freq: Four times a day (QID) | ORAL | 0 refills | Status: AC | PRN

## 2023-05-28 ENCOUNTER — Encounter: Payer: Self-pay | Admitting: Advanced Practice Midwife

## 2023-05-29 ENCOUNTER — Encounter: Payer: Self-pay | Admitting: Obstetrics & Gynecology

## 2023-05-30 ENCOUNTER — Ambulatory Visit: Payer: Self-pay

## 2023-05-31 ENCOUNTER — Other Ambulatory Visit: Payer: Self-pay

## 2023-05-31 ENCOUNTER — Ambulatory Visit (INDEPENDENT_AMBULATORY_CARE_PROVIDER_SITE_OTHER): Payer: Medicaid Other | Admitting: *Deleted

## 2023-05-31 VITALS — BP 100/67 | HR 72 | Ht <= 58 in | Wt 118.1 lb

## 2023-05-31 DIAGNOSIS — Z4889 Encounter for other specified surgical aftercare: Secondary | ICD-10-CM

## 2023-05-31 NOTE — Progress Notes (Addendum)
Pt presents for incision check following C/S on 7/8.  She reports occasional Lt shoulder pain x3 days however no abdominal pain. Pt has been taking Ibuprofen for the shoulder  pain with good results. Incision was assessed and found to be healing well. No redness, swelling or drainage noted. Dermabond remains intact on incision which is otherwise open to air. Proper cleansing and incision care was discussed. Pt has PP appt on 8/23. She voiced understanding of all information and instructions given.

## 2023-07-06 ENCOUNTER — Ambulatory Visit: Payer: Self-pay | Admitting: Family Medicine
